# Patient Record
Sex: Female | Born: 1980 | Race: White | Hispanic: No | Smoking: Never smoker
Health system: Southern US, Community
[De-identification: ages and names within clinical notes are randomized; demographics above are authoritative.]

## PROBLEM LIST (undated history)

## (undated) DIAGNOSIS — G7100 Muscular dystrophy, unspecified: Secondary | ICD-10-CM

## (undated) DIAGNOSIS — E785 Hyperlipidemia, unspecified: Secondary | ICD-10-CM

## (undated) DIAGNOSIS — E079 Disorder of thyroid, unspecified: Secondary | ICD-10-CM

## (undated) HISTORY — PX: ABLATION: SHX5711

## (undated) HISTORY — PX: TUBAL LIGATION: SHX77

## (undated) HISTORY — PX: TONSILLECTOMY: SUR1361

## (undated) HISTORY — PX: FOOT SURGERY: SHX648

## (undated) HISTORY — PX: APPENDECTOMY: SHX54

---
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
## 2002-10-30 ENCOUNTER — Ambulatory Visit (HOSPITAL_COMMUNITY): Admission: RE | Admit: 2002-10-30 | Discharge: 2002-10-30 | Payer: Self-pay | Admitting: Obstetrics and Gynecology

## 2002-10-30 ENCOUNTER — Encounter: Payer: Self-pay | Admitting: Obstetrics and Gynecology

## 2003-02-24 ENCOUNTER — Inpatient Hospital Stay (HOSPITAL_COMMUNITY): Admission: RE | Admit: 2003-02-24 | Discharge: 2003-02-26 | Payer: Self-pay | Admitting: Obstetrics and Gynecology

## 2005-08-29 ENCOUNTER — Emergency Department (HOSPITAL_COMMUNITY): Admission: EM | Admit: 2005-08-29 | Discharge: 2005-08-29 | Payer: Self-pay | Admitting: Emergency Medicine

## 2007-07-23 ENCOUNTER — Emergency Department (HOSPITAL_COMMUNITY): Admission: EM | Admit: 2007-07-23 | Discharge: 2007-07-23 | Payer: Self-pay | Admitting: Emergency Medicine

## 2008-05-15 ENCOUNTER — Emergency Department (HOSPITAL_COMMUNITY): Admission: EM | Admit: 2008-05-15 | Discharge: 2008-05-15 | Payer: Self-pay | Admitting: Emergency Medicine

## 2008-09-04 ENCOUNTER — Encounter: Payer: Self-pay | Admitting: Orthopedic Surgery

## 2008-09-04 ENCOUNTER — Emergency Department (HOSPITAL_COMMUNITY): Admission: EM | Admit: 2008-09-04 | Discharge: 2008-09-04 | Payer: Self-pay | Admitting: Emergency Medicine

## 2008-09-09 ENCOUNTER — Ambulatory Visit: Payer: Self-pay | Admitting: Orthopedic Surgery

## 2008-09-09 DIAGNOSIS — IMO0002 Reserved for concepts with insufficient information to code with codable children: Secondary | ICD-10-CM

## 2008-09-25 ENCOUNTER — Ambulatory Visit: Payer: Self-pay | Admitting: Orthopedic Surgery

## 2008-10-08 ENCOUNTER — Ambulatory Visit: Payer: Self-pay | Admitting: Orthopedic Surgery

## 2008-10-08 ENCOUNTER — Ambulatory Visit (HOSPITAL_COMMUNITY): Admission: RE | Admit: 2008-10-08 | Discharge: 2008-10-08 | Payer: Self-pay | Admitting: Orthopedic Surgery

## 2008-12-29 ENCOUNTER — Emergency Department (HOSPITAL_COMMUNITY): Admission: EM | Admit: 2008-12-29 | Discharge: 2008-12-29 | Payer: Self-pay | Admitting: Emergency Medicine

## 2009-05-24 ENCOUNTER — Emergency Department (HOSPITAL_COMMUNITY): Admission: EM | Admit: 2009-05-24 | Discharge: 2009-05-24 | Payer: Self-pay | Admitting: Emergency Medicine

## 2009-06-17 ENCOUNTER — Emergency Department (HOSPITAL_COMMUNITY): Admission: EM | Admit: 2009-06-17 | Discharge: 2009-06-17 | Payer: Self-pay | Admitting: Emergency Medicine

## 2009-08-01 ENCOUNTER — Emergency Department (HOSPITAL_COMMUNITY): Admission: EM | Admit: 2009-08-01 | Discharge: 2009-08-01 | Payer: Self-pay | Admitting: Emergency Medicine

## 2009-10-23 ENCOUNTER — Emergency Department (HOSPITAL_COMMUNITY): Admission: EM | Admit: 2009-10-23 | Discharge: 2009-10-23 | Payer: Self-pay | Admitting: Emergency Medicine

## 2009-10-23 ENCOUNTER — Ambulatory Visit (HOSPITAL_COMMUNITY): Admission: RE | Admit: 2009-10-23 | Discharge: 2009-10-23 | Payer: Self-pay | Admitting: Family Medicine

## 2010-01-09 ENCOUNTER — Emergency Department (HOSPITAL_COMMUNITY): Admission: EM | Admit: 2010-01-09 | Discharge: 2010-01-09 | Payer: Self-pay | Admitting: Emergency Medicine

## 2010-03-11 ENCOUNTER — Emergency Department (HOSPITAL_COMMUNITY): Admission: EM | Admit: 2010-03-11 | Discharge: 2010-03-11 | Payer: Self-pay | Admitting: Emergency Medicine

## 2010-05-25 ENCOUNTER — Emergency Department (HOSPITAL_COMMUNITY): Admission: EM | Admit: 2010-05-25 | Discharge: 2010-05-25 | Payer: Self-pay | Admitting: Emergency Medicine

## 2010-05-31 ENCOUNTER — Other Ambulatory Visit: Admission: RE | Admit: 2010-05-31 | Discharge: 2010-05-31 | Payer: Self-pay | Admitting: Obstetrics and Gynecology

## 2010-12-06 ENCOUNTER — Other Ambulatory Visit (HOSPITAL_COMMUNITY): Payer: Medicaid Other

## 2010-12-08 ENCOUNTER — Encounter (HOSPITAL_COMMUNITY)
Admission: RE | Admit: 2010-12-08 | Discharge: 2010-12-08 | Disposition: A | Discharge status: Home / Self Care | Payer: Medicaid Other | Source: Ambulatory Visit | Attending: Obstetrics and Gynecology | Admitting: Obstetrics and Gynecology

## 2010-12-08 DIAGNOSIS — Z01812 Encounter for preprocedural laboratory examination: Secondary | ICD-10-CM | POA: Insufficient documentation

## 2010-12-08 LAB — CBC
HCT: 32.6 % — ABNORMAL LOW (ref 36.0–46.0)
MCH: 26.2 pg (ref 26.0–34.0)
MCHC: 32.5 g/dL (ref 30.0–36.0)
MCV: 80.5 fL (ref 78.0–100.0)
RDW: 15.2 % (ref 11.5–15.5)

## 2010-12-11 NOTE — H&P (Signed)
  [name], [name]             ACCOUNT NO.:  [id number]  MEDICAL RECORD NO.:  [id number]           PATIENT TYPE:  O  LOCATION:  SDC                           F[facility]ILITY:  [facility]  PHYSICIAN:  Tilda Burrow, M.D. DATE OF BIRTH:  1980/11/16  DATE OF ADMISSION:  12/08/2010 DATE OF DISCHARGE:  12/08/2010                             HISTORY & PHYSICAL   This is for procedure on December 13, 2010.  ADMISSION DIAGNOSES: 1. Pregnancy at 39 weeks' gestation. 2. Prior cesarean section x2. 3. Not for trial of labor. 4. Elective permanent sterilization.  HISTORY OF PRESENT ILLNESS:  This 30 year old female gravida 3, para 2-0- 0-2, prior C-section x2, both documented low transverse incisions, is scheduled for repeat cesarean section at [facility] at 4 p.m. on December 13, 2010.  Prenatal course has been followed through [facility] through 16 prenatal visits with appropriate weight gain, fundal height growth.  The patient desires to proceed with repeat cesarean section and desires permanent sterilization at the same time.  She signed appropriate consents in the office.  PAST MEDICAL HISTORY:  Late pregnancy course has been notable for complaints of chronic discomfort allegedly related to a back discomfort. More recently, she has been on some Tylox.  This will be tapered immediately upon completion of the cesarean section and appropriate recovery.  She has been treated with muscle relaxers, those not helping her.  She has been offered referral to chiropractic and she has found her best relief when using opiates.  This back pain began in January 2012 allegedly after a sudden sharp pain that began after sneezing.  Prior to that, there was no pain management requirement.  PAST MEDICAL HISTORY:  Benign.  SURGICAL HISTORY:  Cesarean section x2, tonsillectomy, toe surgery.  ALLERGIES:  To PENICILLIN causing unknown reaction.  SOCIAL HISTORY:  Lives with baby's  father.  She is employed.  She denies cigarettes, alcohol, or recreational drugs.  MEDICATIONS:  Prenatal vitamins and Tylox.  FAMILY HISTORY:  Positive for hypertension and diabetes.  PHYSICAL EXAMINATION:  GENERAL:  Shows a healthy-appearing African American female, alert and oriented x3. HEENT:  Pupils equal, round, and reactive.  Extraocular movements intact. NECK:  Supple. CHEST:  Clear to auscultation. ABDOMEN:  Gravid uterus, 38 cm, fundal height 140, vertex presentation, well-healed lower abdominal scar. EXTREMITIES:  Without cyanosis, clubbing, or edema.  The discomfort in the back has been paraspinous muscles.  PLAN:  Repeat cesarean section and bilateral tubal ligation at 4 p.m. in [facility] on December 13, 2010.     Tilda Burrow, M.D.     JVF/MEDQ  D:  12/09/2010  T:  12/09/2010  Job:  [id number]  cc:   [facility]  [facility]  Electronically Signed by Christin Bach M.D. on 12/11/2010 06:42:46 PM

## 2010-12-13 ENCOUNTER — Inpatient Hospital Stay (HOSPITAL_COMMUNITY)
Admission: RE | Admit: 2010-12-13 | Discharge: 2010-12-15 | DRG: 766 | Disposition: A | Discharge status: Home / Self Care | Payer: Medicaid Other | Source: Ambulatory Visit | Attending: Obstetrics and Gynecology | Admitting: Obstetrics and Gynecology

## 2010-12-13 DIAGNOSIS — O34219 Maternal care for unspecified type scar from previous cesarean delivery: Principal | ICD-10-CM | POA: Diagnosis present

## 2010-12-13 DIAGNOSIS — Z302 Encounter for sterilization: Secondary | ICD-10-CM

## 2010-12-13 LAB — TYPE AND SCREEN: Antibody Screen: NEGATIVE

## 2010-12-13 LAB — ABO/RH: ABO/RH(D): A POS

## 2010-12-15 LAB — CBC
Hemoglobin: 8.8 g/dL — ABNORMAL LOW (ref 12.0–15.0)
Platelets: 319 10*3/uL (ref 150–400)
RBC: 3.34 MIL/uL — ABNORMAL LOW (ref 3.87–5.11)

## 2010-12-16 NOTE — Op Note (Signed)
  NAMERAYSHAWN, [name]             ACCOUNT NO.:  [id number]  MEDICAL RECORD NO.:  [id number]           PATIENT TYPE:  I  LOCATION:  9115                          [facility]ILITY:  [facility]  PHYSICIAN:  Tilda Burrow, M.D. DATE OF BIRTH:  12-Feb-1981  DATE OF PROCEDURE:  12/13/2010 DATE OF DISCHARGE:  12/08/2010                              OPERATIVE REPORT   PREOPERATIVE DIAGNOSES: 1. Pregnancy 39 weeks and 2 days. 2. Repeat cesarean section, not for trial of labor. 3. Elective permanent sterilization.  POSTOPERATIVE DIAGNOSES: 1. Pregnancy 39 weeks and 2 days. 2. Repeat cesarean section, not for trial of labor. 3. Elective permanent sterilization.  PROCEDURE:  Repeat low transverse cervical cesarean section.  SURGEONS: 1. Tilda Burrow, MD 2. Lucina Mellow, DO  ANESTHESIA:  Spinal. COMPLICATIONS:  None.  FINDINGS: Healthy female infant. SPECIMENS:  Placenta to Labor and Delivery.  ESTIMATED BLOOD LOSS:  600 mL. COMPLICATIONS:  None.  INDICATIONS:  A 30 year old multipara with 2 prior C-sections, desire repeat C-section and tubal ligation.  DETAILS OF PROCEDURE:  The patient was taken operating room and prepped and draped in usual fashion for lower abdominal surgery time after spinal anesthesia introduced and analgesic effect confirmed.  There was a hotspot distal left of the umbilicus which required a few extra minutes until analgesia was achieved.  Time-out was conducted. Procedure confirmed by all involved parties.  The patient received gentamicin and Cleocin preoperative antibiotics due to penicillin allergy.  Transverse lower abdominal incision was made along the line of the prior Pfannenstiel incision and sharply dissected down to the fascia which was opened transversely and then dissected off the underlying rectus muscles.  Peritoneal cavity was entered in the midline and then the incision into the abdominal cavity completed.  Bladder flap was developed and  transverse uterine incision made with knife and then extended laterally using index finger traction.  Generous amniotic fluid was encountered and it was clear without malodor.  Fetal vertex was guided through the incision.  The infant delivered without further difficulty and passed the pediatrician.  See their notes for details. Placenta was delivered by Crede uterine massage and passed off intact. Three-vessel cord was confirmed plus specimens were obtained.  Uterus was firm.  There was no abnormal bleeding and closure of the uterine incision was a single layer running locking incision.  Bladder flap did not require reapproximation.  Abdomen was cleared of any additional fluid.  A Filshie clip application on each tube at the midportion was performed with each tube visualize to the fimbriated end.  Anterior peritoneum was closed with 2-0 Vicryl.  Fascia closed with running 0 Vicryl.  Subcu tissues approximated with interrupted 2-0 Vicryl x3 sites, and then subcuticular 4-0 Vicryl closure of skin completed the procedure with 500 mL EBL and correct sponge and needle counts.     Tilda Burrow, M.D.     JVF/MEDQ  D:  12/13/2010  T:  12/14/2010  Job:  [id number]  cc:   [facility]  Electronically Signed by Christin Bach M.D. on 12/16/2010 08:46:18 PM

## 2010-12-20 NOTE — Discharge Summary (Addendum)
  [name], [name]             ACCOUNT NO.:  [id number]  MEDICAL RECORD NO.:  [id number]           PATIENT TYPE:  I  LOCATION:  9115                          [facility]  [facility]  PHYSICIAN:  Scheryl Darter, MD       DATE OF BIRTH:  1981/02/25  DATE OF ADMISSION:  12/13/2010 DATE OF DISCHARGE:                              DISCHARGE SUMMARY   ADMISSION DIAGNOSES: 1. Intrauterine pregnancy at 39 weeks and 0 days. 2. Previous cesarean section x2. 3. Desired elective permanent sterilization.  DISCHARGE DIAGNOSES:  Status post repeat low transverse cesarean section with bilateral tubal ligation on December 13, 2010.  ATTENDING DOCTOR:  Horton Chin, MD  FELLOW:  Maryelizabeth Kaufmann, MD  DISCHARGE MEDICATIONS: 1. Percocet 5/325 one tab p.o. q.4 h. as needed. 2. Ibuprofen 600 mg 1 tab p.o. q.6 h. 3. Ferrous sulphate 325 one tab p.o. twice daily. 4. Colace 100 mg 1 tab p.o. twice daily. 5. Multivitamin 1 tab p.o. daily.  OPERATIONS:  She did have a repeat low transverse cesarean section with bilateral tubal ligation on December 13, 2010, by Dr. Emelda Fear and Dr. Natale Milch.  The delivery of a viable female infant, Apgars were 9 and 9, weight was 7 pounds and 2 ounces.  There were no complications immediately.  PERTINENT LABORATORY DATA:  Postoperative hemoglobin was pending at the time of this dictation, but was bothered prior to discharge.  HOSPITAL COURSE:  This is a 30 year old, gravida 3, para 2-0-0-2 with history of 2 previous cesarean section, who presented on the day of admission with intrauterine pregnancy at 39 weeks and 0 days for an elective repeat C-section and elective permanent sterilization.  The patient underwent surgery for that aforementioned reasons with a delivery of a viable female infant.  Her postoperative course was benign.  She was otherwise hemodynamically stable and afebrile at the time of this dictation.  She did have a postoperative hemoglobin  that was pending.  If her hemoglobin is stable, then she will be discharged home in stable condition on postoperative day 2.  DISPOSITION:  Discharged home.  DISCHARGE CONDITION:  Stable.  FOLLOWUP:  The patient to followup in [facility] in 2 weeks for postoperative wound check.  EMERGENCY ROOM WARNINGS:  The patient is to return to the emergency department with any fevers, chills, nausea, vomiting, any problems with her incision such as redness, swelling, discharge, opening or any other concerning symptoms.    ______________________________ Maryelizabeth Kaufmann, MD   ______________________________ Scheryl Darter, MD    LC/MEDQ  D:  12/15/2010  T:  12/15/2010  Job:  [id number]  Electronically Signed by Scheryl Darter MD on 01/17/2011 11:32:52 AM Electronically Signed by Maryelizabeth Kaufmann MD on 01/24/2011 12:25:28 PM

## 2011-01-03 LAB — CBC
Hemoglobin: 11.5 g/dL — ABNORMAL LOW (ref 12.0–15.0)
MCHC: 33.8 g/dL (ref 30.0–36.0)
MCV: 82.8 fL (ref 78.0–100.0)
RBC: 4.13 MIL/uL (ref 3.87–5.11)
WBC: 14.1 10*3/uL — ABNORMAL HIGH (ref 4.0–10.5)

## 2011-01-03 LAB — BASIC METABOLIC PANEL
CO2: 25 mEq/L (ref 19–32)
Calcium: 9.3 mg/dL (ref 8.4–10.5)
Chloride: 104 mEq/L (ref 96–112)
GFR calc Af Amer: >60 mL/min (ref >60)
Sodium: 136 mEq/L (ref 135–145)

## 2011-01-03 LAB — URINALYSIS, ROUTINE W REFLEX MICROSCOPIC
Bilirubin Urine: NEGATIVE
Glucose, UA: NEGATIVE mg/dL
Hgb urine dipstick: NEGATIVE
Specific Gravity, Urine: 1.01 (ref 1.005–1.030)
Urobilinogen, UA: 0.2 mg/dL (ref 0.0–1.0)

## 2011-01-03 LAB — DIFFERENTIAL
Basophils Relative: 1 % (ref 0–1)
Lymphs Abs: 4.2 10*3/uL — ABNORMAL HIGH (ref 0.7–4.0)
Monocytes Absolute: 0.6 10*3/uL (ref 0.1–1.0)
Monocytes Relative: 4 % (ref 3–12)
Neutro Abs: 8.8 10*3/uL — ABNORMAL HIGH (ref 1.7–7.7)

## 2011-01-03 LAB — POCT PREGNANCY, URINE: Preg Test, Ur: NEGATIVE

## 2011-01-09 LAB — COMPREHENSIVE METABOLIC PANEL
Albumin: 3.7 g/dL (ref 3.5–5.2)
Alkaline Phosphatase: 83 U/L (ref 39–117)
BUN: 9 mg/dL (ref 6–23)
CO2: 29 mEq/L (ref 19–32)
Chloride: 103 mEq/L (ref 96–112)
Creatinine, Ser: 0.85 mg/dL (ref 0.4–1.2)
GFR calc non Af Amer: >60 mL/min (ref >60)
Glucose, Bld: 99 mg/dL (ref 70–99)
Potassium: 4.1 mEq/L (ref 3.5–5.1)
Total Bilirubin: 0.3 mg/dL (ref 0.3–1.2)

## 2011-01-09 LAB — CBC
HCT: 35.1 % — ABNORMAL LOW (ref 36.0–46.0)
Hemoglobin: 12.1 g/dL (ref 12.0–15.0)
MCV: 82.5 fL (ref 78.0–100.0)
Platelets: 482 10*3/uL — ABNORMAL HIGH (ref 150–400)
RBC: 4.26 MIL/uL (ref 3.87–5.11)
WBC: 9.5 10*3/uL (ref 4.0–10.5)

## 2011-01-09 LAB — DIFFERENTIAL
Basophils Absolute: 0.1 10*3/uL (ref 0.0–0.1)
Basophils Relative: 1 % (ref 0–1)
Lymphocytes Relative: 26 % (ref 12–46)
Monocytes Absolute: 0.5 10*3/uL (ref 0.1–1.0)
Neutro Abs: 6.2 10*3/uL (ref 1.7–7.7)
Neutrophils Relative %: 65 % (ref 43–77)

## 2011-01-09 LAB — LIPASE, BLOOD: Lipase: 40 U/L (ref 11–59)

## 2011-01-20 LAB — BASIC METABOLIC PANEL
BUN: 11 mg/dL (ref 6–23)
Calcium: 9.5 mg/dL (ref 8.4–10.5)
GFR calc non Af Amer: >60 mL/min (ref >60)
Glucose, Bld: 131 mg/dL — ABNORMAL HIGH (ref 70–99)
Potassium: 3.4 mEq/L — ABNORMAL LOW (ref 3.5–5.1)
Sodium: 140 mEq/L (ref 135–145)

## 2011-01-20 LAB — DIFFERENTIAL
Basophils Absolute: 0 10*3/uL (ref 0.0–0.1)
Lymphocytes Relative: 26 % (ref 12–46)
Lymphs Abs: 3.2 10*3/uL (ref 0.7–4.0)
Neutro Abs: 7.9 10*3/uL — ABNORMAL HIGH (ref 1.7–7.7)

## 2011-01-20 LAB — CBC
Hemoglobin: 12.1 g/dL (ref 12.0–15.0)
Platelets: 414 10*3/uL — ABNORMAL HIGH (ref 150–400)
RDW: 13.9 % (ref 11.5–15.5)
WBC: 12 10*3/uL — ABNORMAL HIGH (ref 4.0–10.5)

## 2011-01-21 LAB — URINALYSIS, ROUTINE W REFLEX MICROSCOPIC
Nitrite: NEGATIVE
Specific Gravity, Urine: >1.03 — ABNORMAL HIGH (ref 1.005–1.030)
Urobilinogen, UA: 0.2 mg/dL (ref 0.0–1.0)
pH: 6 (ref 5.0–8.0)

## 2011-01-21 LAB — PREGNANCY, URINE: Preg Test, Ur: NEGATIVE

## 2011-01-21 LAB — WET PREP, GENITAL
Clue Cells Wet Prep HPF POC: NONE SEEN
Trich, Wet Prep: NONE SEEN
WBC, Wet Prep HPF POC: NONE SEEN

## 2011-01-27 LAB — URINE MICROSCOPIC-ADD ON

## 2011-01-27 LAB — URINALYSIS, ROUTINE W REFLEX MICROSCOPIC
Glucose, UA: NEGATIVE mg/dL
Ketones, ur: NEGATIVE mg/dL
Protein, ur: NEGATIVE mg/dL
Urobilinogen, UA: 0.2 mg/dL (ref 0.0–1.0)

## 2011-03-04 NOTE — Op Note (Signed)
NAME:  [name], [name]                       ACCOUNT NO.:  [id number]   MEDICAL RECORD NO.:  [id number]                   PATIENT TYPE:  AMB   LOCATION:  DAY                                  FACILITY:  [facility]   PHYSICIAN:  Tilda Burrow, M.D.              DATE OF BIRTH:  01-26-1981   DATE OF PROCEDURE:  DATE OF DISCHARGE:                                 OPERATIVE REPORT   PREOPERATIVE DIAGNOSES:  1. Pregnancy 39-1/[redacted] weeks gestation.  2. Prior cesarean section not for trial of labor.   POSTOPERATIVE DIAGNOSES:  1. Pregnancy 39-1/[redacted] weeks gestation.  2. Prior cesarean section not for trial of labor.   PROCEDURE:  Repeat low transverse cervical cesarean section.   SURGEON:  Tilda Burrow, M.D.   ASSISTANTMarlinda Mike, RN; Karna Dupes, [place]   ANESTHESIA:  Spinal--T. Ophelia Charter, CRNA   COMPLICATIONS:  None.   FINDINGS:  Healthy female infant, Apgars 9 and 9, weight _________.   INDICATIONS:  A 30 year old female not requesting trial of labor.   DETAILS OF PROCEDURE:  The patient was taken to the operating room and  spinal anesthesia introduced and Pfannenstiel-type incision repeated with  excision of the thin cicatrix.  The peritoneal cavity was entered without  difficulty and bladder flap developed over the lower uterine segment which  was quite thin.  A transverse uterine incision was made, extended laterally  using index finger traction and forewaters confirmed as clear.   Fundal pressure was used to deliver the infant with successful expulsion of  a healthy female infant with Apgars of 9 and 9 without difficulty.  Nuchal  cord x1 was encountered and released.  The cord was milked towards the  infant and then clamped cut and passed to Harley Hallmark, RN for neonatal  support.   Cord blood samples were obtained and then placenta delivered [facility]  presentation.  Membranes were intact.  Uterine irrigation with antibiotic  solution was performed.  The transverse incision was only  approximately 1-  1.5 cm above the internal os.  A single layer of running locking closure of  the uterus incision was performed with a small hematoma at the right corner  requiring a single additional stitch on the inferior aspects of the uterine  incision.  The bladder flap was reapproximated with running 2-0 chromic,  then the abdomen irrigated, laparotomy equipment and tapes removed, and  anterior peritoneum closed with 2-0 chromic.  The fascia was reapproximated  with continuous  running #0 Vicryl.  The subcutaneous tissues contoured slightly to allow for  good closure by release of some fibrosis on the inferior aspects of the  incision and then reapproximated with 3 interrupted of 2-0 plain.  Staple  closure of skin completed the procedure.  Estimated blood loss 400 cc.  Tilda Burrow, M.D.    JVF/MEDQ  D:  02/24/2003  T:  02/24/2003  Job:  [id number]   cc:   ATTENTION: [facility]. [facility]

## 2011-03-04 NOTE — Discharge Summary (Signed)
   [name], [name]                       ACCOUNT NO.:  [id number]   MEDICAL RECORD NO.:  [id number]                   PATIENT TYPE:  INP   LOCATION:  A418                                 FACILITY:  [facility]   PHYSICIAN:  Tilda Burrow, M.D.              DATE OF BIRTH:  04/28/1981   DATE OF ADMISSION:  02/24/2003  DATE OF DISCHARGE:  02/26/2003                                 DISCHARGE SUMMARY   ADMITTING DIAGNOSIS:  Pregnancy at 40+ weeks gestation.  Repeat cesarean  section.   DISCHARGE DIAGNOSIS:  Pregnancy at 40+ weeks gestation.  Repeat cesarean  section.   PROCEDURES:  1. 02/24/03, repeat low transverse cervical cesarean section, delivering a     healthy female infant, Apgars 9, 9.  2. Circumcision of female infant on 02/25/03.   DISCHARGE MEDICATIONS:  Darvocet-N 100, 30 tablets, 1-2 Q4H p.r.n. pain.   DISCHARGE INSTRUCTIONS:  Staples removed, postop day 2.  Follow-up  contraception planned.  IUD placement.  Postpartum visit four weeks.   HOSPITAL SUMMARY:  This 30 year old female, gravida 2, para 1, ab 0, now  gravida 2, para 2, was admitted for repeat low transverse cervical cesarean  section without plans for sterilization.   HOSPITAL COURSE:  The patient was admitted as described in the admitting  history after an uncomplicated prenatal course.  She underwent a  straightforward cesarean section with 400 mL blood loss through a transverse  lower abdominal incision.  She had a very easily tolerated postoperative  course and was ambulatory with Foley out in six hours postop.  Hemoglobin  was 10.  Hematocrit was 29.5 on postoperative day #1.  The baby was  circumcised.  On postop day 2, she was discharged.  Staples were removed.  Followup was arranged for four weeks, at which time IUD will be inserted or  scheduled that should coincide with her first period.  The patient is bottle  feeding.                                               Tilda Burrow, M.D.    JVF/MEDQ  D:  02/26/2003  T:  02/27/2003  Job:  [id number]

## 2011-03-04 NOTE — H&P (Signed)
[name], [name]                       ACCOUNT NO.:  [id number]   MEDICAL RECORD NO.:  [id number]                   PATIENT TYPE:   LOCATION:                                       FACILITY:   PHYSICIAN:  Tilda Burrow, M.D.              DATE OF BIRTH:  09/02/1981   DATE OF ADMISSION:  02/24/2003  DATE OF DISCHARGE:                                HISTORY & PHYSICAL   ADMISSION DIAGNOSES:  1. Pregnancy at 40 weeks' gestation.  2. Prior cesarean section, for repeat cesarean section.   HISTORY OF PRESENT ILLNESS:  This is a 30 year old female, gravida 2, para  1, AB 0, LMP ? 04/30/02, due to Depo-Provera, placing menstrual EDC of  02/05/03.  Her ultrasound-assigned EDC, considered more correct, is 03/01/03  based on 20-week ultrasound.  The patient has been followed through our  office since December for routine prenatal care with an uncomplicated  course.  She is now 39+ weeks by that ultrasound criteria and is admitted  for repeat cesarean section.  She has a history of a C-section of a 7 pound  6 ounce infant for failure to progress in March 2002.  This baby is  considered larger.  The patient does not consider herself a candidate for  sterilization and plans only repeat C-section.   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   PAST SURGICAL HISTORY:  Laparoscopic appendectomy in 2000.  Right toe  surgery.  Tonsillectomy as a child.  Cesarean section 3/02.   ALLERGIES:  PENICILLIN.  The patient specifically states she can take Keflex  and cephalosporins.   MEDICATIONS:  None except prenatal vitamins.   PHYSICAL EXAMINATION:  VITAL SIGNS:  Height 5 feet 4 inches, weight 200.  GENERAL:  A cheerful, pleasant, healthy-appearing Caucasian female, alert  and oriented x3.  HEENT:  Pupils equal, round, and reactive.  Extraocular movements intact.  NECK:  Supple.   Trachea midline.  CHEST:  Clear to auscultation.  ABDOMEN:  Fundal height 39 cm.  Estimated fetal weight 8 pounds.  PELVIC:  Cervix deferred at this time but firm, closed, posterior, and long  one week ago.   PRENATAL LABORATORY DATA:  Blood type A positive.  Urine drug screen  negative.  RPR nonreactive.  Hepatitis, HIV, GC, and Chlamydia also  negative.  Rubella immunity present.  MSAFP normal with a one in 800 risk of  Down syndrome.  Prior group B strep was positive.  Hemoglobin 10, hematocrit  33 at 28 weeks.    PLAN:  The patient's husband, Molly Maduro, plans to attend the delivery with her,  and she plans IUD for contraception in the future.  Circumcision is planned  if it is a female infant.   Plan repeat cesarean section 02/24/03, 7:30 a.m.  Tilda Burrow, M.D.    JVF/MEDQ  D:  02/19/2003  T:  02/19/2003  Job:  [id number]

## 2011-04-03 ENCOUNTER — Emergency Department (HOSPITAL_COMMUNITY)
Admission: EM | Admit: 2011-04-03 | Discharge: 2011-04-03 | Disposition: A | Discharge status: Home / Self Care | Payer: Self-pay | Attending: Emergency Medicine | Admitting: Emergency Medicine

## 2011-04-03 ENCOUNTER — Emergency Department (HOSPITAL_COMMUNITY): Payer: Self-pay

## 2011-04-03 DIAGNOSIS — N390 Urinary tract infection, site not specified: Secondary | ICD-10-CM | POA: Insufficient documentation

## 2011-04-03 DIAGNOSIS — R112 Nausea with vomiting, unspecified: Secondary | ICD-10-CM | POA: Insufficient documentation

## 2011-04-03 DIAGNOSIS — G8929 Other chronic pain: Secondary | ICD-10-CM | POA: Insufficient documentation

## 2011-04-03 DIAGNOSIS — R109 Unspecified abdominal pain: Secondary | ICD-10-CM | POA: Insufficient documentation

## 2011-04-03 DIAGNOSIS — G7109 Other specified muscular dystrophies: Secondary | ICD-10-CM | POA: Insufficient documentation

## 2011-04-03 DIAGNOSIS — E7800 Pure hypercholesterolemia, unspecified: Secondary | ICD-10-CM | POA: Insufficient documentation

## 2011-04-03 LAB — HEPATIC FUNCTION PANEL
Alkaline Phosphatase: 112 U/L (ref 39–117)
Indirect Bilirubin: 0.3 mg/dL (ref 0.3–0.9)
Total Bilirubin: 0.4 mg/dL (ref 0.3–1.2)
Total Protein: 8.1 g/dL (ref 6.0–8.3)

## 2011-04-03 LAB — URINE MICROSCOPIC-ADD ON

## 2011-04-03 LAB — URINALYSIS, ROUTINE W REFLEX MICROSCOPIC
Specific Gravity, Urine: >1.03 — ABNORMAL HIGH (ref 1.005–1.030)
Urobilinogen, UA: 0.2 mg/dL (ref 0.0–1.0)
pH: 6 (ref 5.0–8.0)

## 2011-04-03 LAB — DIFFERENTIAL
Basophils Absolute: 0 10*3/uL (ref 0.0–0.1)
Eosinophils Relative: 4 % (ref 0–5)
Lymphocytes Relative: 18 % (ref 12–46)
Lymphs Abs: 2.8 10*3/uL (ref 0.7–4.0)
Neutro Abs: 11.8 10*3/uL — ABNORMAL HIGH (ref 1.7–7.7)
Neutrophils Relative %: 74 % (ref 43–77)

## 2011-04-03 LAB — CBC
HCT: 37 % (ref 36.0–46.0)
Hemoglobin: 12 g/dL (ref 12.0–15.0)
RBC: 4.76 MIL/uL (ref 3.87–5.11)
RDW: 14.4 % (ref 11.5–15.5)
WBC: 16 10*3/uL — ABNORMAL HIGH (ref 4.0–10.5)

## 2011-04-03 LAB — LIPASE, BLOOD: Lipase: 53 U/L (ref 11–59)

## 2011-04-03 LAB — BASIC METABOLIC PANEL
CO2: 25 mEq/L (ref 19–32)
Calcium: 9.9 mg/dL (ref 8.4–10.5)
GFR calc non Af Amer: >60 mL/min (ref >60)
Sodium: 139 mEq/L (ref 135–145)

## 2011-04-03 LAB — POCT PREGNANCY, URINE: Preg Test, Ur: NEGATIVE

## 2011-04-03 MED ORDER — IOHEXOL 300 MG/ML  SOLN
100.0000 mL | Freq: Once | INTRAMUSCULAR | Status: AC | PRN
  Administered 2011-04-03: 100 mL via INTRAVENOUS

## 2011-12-25 ENCOUNTER — Encounter (HOSPITAL_COMMUNITY): Payer: Self-pay | Admitting: Emergency Medicine

## 2011-12-25 ENCOUNTER — Emergency Department (HOSPITAL_COMMUNITY)
Admission: EM | Admit: 2011-12-25 | Discharge: 2011-12-25 | Disposition: A | Discharge status: Home Health | Payer: Medicaid Other | Attending: Emergency Medicine | Admitting: Emergency Medicine

## 2011-12-25 DIAGNOSIS — S20229A Contusion of unspecified back wall of thorax, initial encounter: Secondary | ICD-10-CM

## 2011-12-25 DIAGNOSIS — S39012A Strain of muscle, fascia and tendon of lower back, initial encounter: Secondary | ICD-10-CM

## 2011-12-25 DIAGNOSIS — E079 Disorder of thyroid, unspecified: Secondary | ICD-10-CM | POA: Insufficient documentation

## 2011-12-25 DIAGNOSIS — E785 Hyperlipidemia, unspecified: Secondary | ICD-10-CM | POA: Insufficient documentation

## 2011-12-25 DIAGNOSIS — Y990 Civilian activity done for income or pay: Secondary | ICD-10-CM | POA: Insufficient documentation

## 2011-12-25 DIAGNOSIS — R296 Repeated falls: Secondary | ICD-10-CM | POA: Insufficient documentation

## 2011-12-25 DIAGNOSIS — S335XXA Sprain of ligaments of lumbar spine, initial encounter: Secondary | ICD-10-CM | POA: Insufficient documentation

## 2011-12-25 DIAGNOSIS — G7109 Other specified muscular dystrophies: Secondary | ICD-10-CM | POA: Insufficient documentation

## 2011-12-25 DIAGNOSIS — Y9289 Other specified places as the place of occurrence of the external cause: Secondary | ICD-10-CM | POA: Insufficient documentation

## 2011-12-25 HISTORY — DX: Hyperlipidemia, unspecified: E78.5

## 2011-12-25 HISTORY — DX: Disorder of thyroid, unspecified: E07.9

## 2011-12-25 HISTORY — DX: Muscular dystrophy, unspecified: G71.00

## 2011-12-25 MED ORDER — IBUPROFEN 800 MG PO TABS: 800.0000 mg | ORAL_TABLET | Freq: Three times a day (TID) | ORAL | Status: AC

## 2011-12-25 MED ORDER — BACLOFEN 10 MG PO TABS: 10.0000 mg | ORAL_TABLET | Freq: Three times a day (TID) | ORAL | Status: AC

## 2011-12-25 MED ORDER — DIAZEPAM 5 MG PO TABS
5.0000 mg | ORAL_TABLET | Freq: Once | ORAL | Status: AC
  Administered 2011-12-25: 5 mg via ORAL
  Filled 2011-12-25: qty 1

## 2011-12-25 MED ORDER — HYDROCODONE-ACETAMINOPHEN 7.5-325 MG PO TABS: 1.0000 | ORAL_TABLET | ORAL | Status: AC | PRN

## 2011-12-25 MED ORDER — ONDANSETRON HCL 4 MG PO TABS
4.0000 mg | ORAL_TABLET | Freq: Once | ORAL | Status: AC
  Administered 2011-12-25: 4 mg via ORAL
  Filled 2011-12-25: qty 1

## 2011-12-25 MED ORDER — MORPHINE SULFATE 4 MG/ML IJ SOLN
8.0000 mg | Freq: Once | INTRAMUSCULAR | Status: AC
  Administered 2011-12-25: 8 mg via INTRAMUSCULAR
  Filled 2011-12-25: qty 2

## 2011-12-25 NOTE — ED Notes (Signed)
Pt tripped at work and fell over box and landed on back.  CC of mid to lower back pain

## 2011-12-25 NOTE — Discharge Instructions (Signed)
Please apply ice pack to your lower back today and tonight. Starting March 11 please alternate heat and ice to your lower back. Please rest her back as much as possible. Ibuprofen 800 mg 3 times daily with food, baclofen 3 times daily for spasm. Use Norco for pain if needed. Norco and baclofen may cause drowsiness, please use with caution.Contusion A contusion is a deep bruise. Contusions are the result of an injury that caused bleeding under the skin. The contusion may turn blue, purple, or yellow. Minor injuries will give you a painless contusion, but more severe contusions may stay painful and swollen for a few weeks.  CAUSES  A contusion is usually caused by a blow, trauma, or direct force to an area of the body. SYMPTOMS   Swelling and redness of the injured area.   Bruising of the injured area.   Tenderness and soreness of the injured area.   Pain.  DIAGNOSIS  The diagnosis can be made by taking a history and physical exam. An X-ray, CT scan, or MRI may be needed to determine if there were any associated injuries, such as fractures. TREATMENT  Specific treatment will depend on what area of the body was injured. In general, the best treatment for a contusion is resting, icing, elevating, and applying cold compresses to the injured area. Over-the-counter medicines may also be recommended for pain control. Ask your caregiver what the best treatment is for your contusion. HOME CARE INSTRUCTIONS   Put ice on the injured area.   Put ice in a plastic bag.   Place a towel between your skin and the bag.   Leave the ice on for 15 to 20 minutes, 3 to 4 times a day.   Only take over-the-counter or prescription medicines for pain, discomfort, or fever as directed by your caregiver. Your caregiver may recommend avoiding anti-inflammatory medicines (aspirin, ibuprofen, and naproxen) for 48 hours because these medicines may increase bruising.   Rest the injured area.   If possible, elevate the  injured area to reduce swelling.  SEEK IMMEDIATE MEDICAL CARE IF:   You have increased bruising or swelling.   You have pain that is getting worse.   Your swelling or pain is not relieved with medicines.  MAKE SURE YOU:   Understand these instructions.   Will watch your condition.   Will get help right away if you are not doing well or get worse.  Document Released: 07/13/2005 Document Revised: 09/22/2011 Document Reviewed: 08/08/2011 [facility] Patient Information 2012 [place], [place].Muscle Strain A muscle strain, or pulled muscle, occurs when a muscle is over-stretched. A small number of muscle fibers may also be torn. This is especially common in athletes. This happens when a sudden violent force placed on a muscle pushes it past its capacity. Usually, recovery from a pulled muscle takes 1 to 2 weeks. But complete healing will take 5 to 6 weeks. There are millions of muscle fibers. Following injury, your body will usually return to normal quickly. HOME CARE INSTRUCTIONS   While awake, apply ice to the sore muscle for 15 to 20 minutes each hour for the first 2 days. Put ice in a plastic bag and place a towel between the bag of ice and your skin.   Do not use the pulled muscle for several days. Do not use the muscle if you have pain.   You may wrap the injured area with an elastic bandage for comfort. Be careful not to bind it too tightly. This may  interfere with blood circulation.   Only take over-the-counter or prescription medicines for pain, discomfort, or fever as directed by your caregiver. Do not use aspirin as this will increase bleeding (bruising) at injury site.   Warming up before exercise helps prevent muscle strains.  SEEK MEDICAL CARE IF:  There is increased pain or swelling in the affected area. MAKE SURE YOU:   Understand these instructions.   Will watch your condition.   Will get help right away if you are not doing well or get worse.  Document Released:  10/03/2005 Document Revised: 09/22/2011 Document Reviewed: 05/02/2007 [facility] Patient Information 2012 [place], [place].

## 2011-12-25 NOTE — ED Provider Notes (Signed)
History     CSN: [id number]  Arrival date & time 12/25/11  1244   First MD Initiated Contact with Patient 12/25/11 1254      Chief Complaint  Patient presents with  . Back Pain    (Consider location/radiation/quality/duration/timing/severity/associated sxs/prior treatment) Patient is a 31 y.o. female presenting with back pain. The history is provided by the patient.  Back Pain  This is a new problem. The current episode started yesterday. The problem occurs constantly. The problem has been gradually worsening. The pain is associated with falling (Patient fell over boxes at her job.). The pain is present in the lumbar spine. The quality of the pain is described as aching (spasm). The pain does not radiate. The pain is at a severity of 10/10. The pain is severe. The symptoms are aggravated by certain positions. The pain is the same all the time. Pertinent negatives include no chest pain, no numbness, no abdominal pain, no bowel incontinence, no perianal numbness, no bladder incontinence, no dysuria, no paresthesias and no paresis.    Past Medical History  Diagnosis Date  . Muscular dystrophy   . Thyroid disease   . Hyperlipemia     History reviewed. No pertinent past surgical history.  No family history on file.  History  Substance Use Topics  . Smoking status: Never Smoker   . Smokeless tobacco: Not on file  . Alcohol Use: No    OB History    Grav Para Term Preterm Abortions TAB SAB Ect Mult Living                  Review of Systems  Constitutional: Negative for activity change.       All ROS Neg except as noted in HPI  HENT: Negative for nosebleeds and neck pain.   Eyes: Negative for photophobia and discharge.  Respiratory: Negative for cough, shortness of breath and wheezing.   Cardiovascular: Negative for chest pain and palpitations.  Gastrointestinal: Negative for abdominal pain, blood in stool and bowel incontinence.  Genitourinary: Negative for bladder  incontinence, dysuria, frequency and hematuria.  Musculoskeletal: Positive for back pain. Negative for arthralgias.  Skin: Negative.   Neurological: Negative for dizziness, seizures, speech difficulty, numbness and paresthesias.  Psychiatric/Behavioral: Negative for hallucinations and confusion.    Allergies  Other and Penicillins  Home Medications   Current Outpatient Rx  Name Route Sig Dispense Refill  . ACETAMINOPHEN 500 MG PO TABS Oral Take 1,000 mg by mouth every 6 (six) hours as needed. For pain    . BACLOFEN 10 MG PO TABS Oral Take 1 tablet (10 mg total) by mouth 3 (three) times daily. 21 each 0  . HYDROCODONE-ACETAMINOPHEN 7.5-325 MG PO TABS Oral Take 1 tablet by mouth every 4 (four) hours as needed for pain. 20 tablet 0  . IBUPROFEN 800 MG PO TABS Oral Take 1 tablet (800 mg total) by mouth 3 (three) times daily. 21 tablet 0    BP 117/77  Pulse 87  Temp(Src) 97.8 F (36.6 C) (Oral)  Resp 20  Ht 5\' 6"  (1.676 m)  Wt 210 lb (95.255 kg)  BMI 33.89 kg/m2  SpO2 100%  Physical Exam  Nursing note and vitals reviewed. Constitutional: She is oriented to person, place, and time. She appears well-developed and well-nourished.  Non-toxic appearance.  HENT:  Head: Normocephalic.  Right Ear: Tympanic membrane and external ear normal.  Left Ear: Tympanic membrane and external ear normal.  Eyes: EOM and lids are normal. Pupils are equal,  round, and reactive to light.  Neck: Normal range of motion. Neck supple. Carotid bruit is not present.  Cardiovascular: Normal rate, regular rhythm, normal heart sounds, intact distal pulses and normal pulses.   Pulmonary/Chest: Breath sounds normal. No respiratory distress.       No rib area pain.  Abdominal: Soft. Bowel sounds are normal. There is no tenderness. There is no guarding.  Musculoskeletal:       There is muscle tightness and spasm at the lower lumbar area. There is pain and mild bruising of the top of the buttocks area. There is  minimal soreness over the coccyx area.  Lymphadenopathy:       Head (right side): No submandibular adenopathy present.       Head (left side): No submandibular adenopathy present.    She has no cervical adenopathy.  Neurological: She is alert and oriented to person, place, and time. She has normal strength. No cranial nerve deficit or sensory deficit. She exhibits normal muscle tone. Coordination normal.  Skin: Skin is warm and dry.  Psychiatric: She has a normal mood and affect. Her speech is normal.    ED Course  Procedures (including critical care time)  Labs Reviewed - No data to display No results found.   1. Contusion, back   2. Lumbar strain       MDM  I have reviewed nursing notes, vital signs, and all appropriate lab and imaging results for this patient. Patient sustained a fall when she stumbled over boxes and fell backwards. She now has pain and bruising of the lower back and buttocks area. There's been no loss of bowel or bladder function. Prescription for baclofen 10 mg, Norco 7.5 mg, and ibuprofen 800 mg given. Patient is to use ice pack to the area tonight and then alternate heat and ice for comfort. Patient is advised to return to the emergency department if not improving.       Kathie Dike, [place] 12/25/11 1450

## 2011-12-25 NOTE — ED Provider Notes (Signed)
Medical screening examination/treatment/procedure(s) were performed by non-physician practitioner and as supervising physician I was immediately available for consultation/collaboration. Haniah Penny, MD, FACEP   Emry Barbato L Ameerah Huffstetler, MD 12/25/11 1631 

## 2011-12-25 NOTE — ED Notes (Signed)
Pt DC to home with steady gait.  Ride waiting in [place]

## 2012-12-24 ENCOUNTER — Encounter (HOSPITAL_COMMUNITY): Payer: Self-pay | Admitting: *Deleted

## 2012-12-24 ENCOUNTER — Emergency Department (HOSPITAL_COMMUNITY)
Admission: EM | Admit: 2012-12-24 | Discharge: 2012-12-25 | Disposition: A | Discharge status: Home / Self Care | Payer: Medicaid Other | Attending: Emergency Medicine | Admitting: Emergency Medicine

## 2012-12-24 DIAGNOSIS — Z79899 Other long term (current) drug therapy: Secondary | ICD-10-CM | POA: Insufficient documentation

## 2012-12-24 DIAGNOSIS — Z862 Personal history of diseases of the blood and blood-forming organs and certain disorders involving the immune mechanism: Secondary | ICD-10-CM | POA: Insufficient documentation

## 2012-12-24 DIAGNOSIS — Z8639 Personal history of other endocrine, nutritional and metabolic disease: Secondary | ICD-10-CM | POA: Insufficient documentation

## 2012-12-24 DIAGNOSIS — Z8719 Personal history of other diseases of the digestive system: Secondary | ICD-10-CM | POA: Insufficient documentation

## 2012-12-24 DIAGNOSIS — R112 Nausea with vomiting, unspecified: Secondary | ICD-10-CM | POA: Insufficient documentation

## 2012-12-24 DIAGNOSIS — Z8669 Personal history of other diseases of the nervous system and sense organs: Secondary | ICD-10-CM | POA: Insufficient documentation

## 2012-12-24 DIAGNOSIS — R1013 Epigastric pain: Secondary | ICD-10-CM | POA: Insufficient documentation

## 2012-12-24 DIAGNOSIS — Z9089 Acquired absence of other organs: Secondary | ICD-10-CM | POA: Insufficient documentation

## 2012-12-24 DIAGNOSIS — Z9851 Tubal ligation status: Secondary | ICD-10-CM | POA: Insufficient documentation

## 2012-12-24 MED ORDER — SUCRALFATE 1 GM/10ML PO SUSP
1.0000 g | Freq: Once | ORAL | Status: AC
  Administered 2012-12-24: 1 g via ORAL
  Filled 2012-12-24: qty 10

## 2012-12-24 MED ORDER — PROMETHAZINE HCL 25 MG PO TABS: 25.0000 mg | ORAL_TABLET | Freq: Four times a day (QID) | ORAL | Status: DC | PRN

## 2012-12-24 MED ORDER — HYDROCODONE-ACETAMINOPHEN 5-325 MG PO TABS: 1.0000 | ORAL_TABLET | ORAL | Status: DC | PRN

## 2012-12-24 MED ORDER — HYDROCODONE-ACETAMINOPHEN 5-325 MG PO TABS
1.0000 | ORAL_TABLET | Freq: Once | ORAL | Status: AC
  Administered 2012-12-25: 1 via ORAL
  Filled 2012-12-24: qty 1

## 2012-12-24 MED ORDER — SUCRALFATE 1 G PO TABS
ORAL_TABLET | ORAL | Status: AC
  Filled 2012-12-24: qty 1

## 2012-12-24 NOTE — ED Notes (Signed)
Pt has mid upper abd pain x1 week, was seen 3 days ago at [facility] and given CT scan and [place]. States they did not show anything and she was discharged. Waiting on results to images from [facility].

## 2012-12-24 NOTE — ED Notes (Signed)
Upper abd pain, Recent adm to [facility] for similar sx and told she had reflux after having ct scan and U/S  Released from [facility] on Saturday 3/8

## 2012-12-24 NOTE — ED Notes (Signed)
Burgess Amor PA in room assessing pt at this time

## 2012-12-25 ENCOUNTER — Encounter: Payer: Self-pay | Admitting: Gastroenterology

## 2012-12-25 ENCOUNTER — Ambulatory Visit (INDEPENDENT_AMBULATORY_CARE_PROVIDER_SITE_OTHER): Payer: Medicaid Other | Admitting: Gastroenterology

## 2012-12-25 VITALS — BP 123/86 | HR 106 | Temp 98.2°F | Ht 65.0 in | Wt 200.6 lb

## 2012-12-25 DIAGNOSIS — R1011 Right upper quadrant pain: Secondary | ICD-10-CM

## 2012-12-25 LAB — HEPATIC FUNCTION PANEL
Albumin: 4.2 g/dL (ref 3.5–5.2)
Total Protein: 8.3 g/dL (ref 6.0–8.3)

## 2012-12-25 LAB — CBC
Hemoglobin: 12.5 g/dL (ref 12.0–15.0)
RBC: 4.95 MIL/uL (ref 3.87–5.11)

## 2012-12-25 LAB — BASIC METABOLIC PANEL
CO2: 27 mEq/L (ref 19–32)
Glucose, Bld: 94 mg/dL (ref 70–99)
Potassium: 4 mEq/L (ref 3.5–5.3)
Sodium: 138 mEq/L (ref 135–145)

## 2012-12-25 LAB — LIPASE: Lipase: 50 U/L (ref 11–59)

## 2012-12-25 NOTE — Progress Notes (Signed)
No PCP on file 

## 2012-12-25 NOTE — ED Provider Notes (Signed)
Medical screening examination/treatment/procedure(s) were performed by non-physician practitioner and as supervising physician I was immediately available for consultation/collaboration.   Charles B. Bernette Mayers, MD 12/25/12 1610

## 2012-12-25 NOTE — ED Notes (Signed)
Pt reporting being severe pain and angry that she isn't given more than one Vicodin tonight.  Pt laughing, texting and talking with friends.

## 2012-12-25 NOTE — Progress Notes (Signed)
Quick Note:  I contacted patient and informed of overall unimpressive results. LFTs, renal function look good. No anemia. Mild leukocytosis and thrombocytosis. Question reactive. Appears her white count has been mildly elevated in the past as well. When I contacted her, she sounded upbeat and not in distress as she had seemed in clinic. However, she states she is still experiencing the same pain and nausea. Plans remain for EGD on 3/12. Consider HIDA scan if EGD unrevealing. ______

## 2012-12-25 NOTE — Progress Notes (Signed)
Primary Care Physician:  No primary provider on file. Primary Gastroenterologist:  Dr. Rourk   Chief Complaint  Patient presents with  . Abdominal Pain    HPI:   [name] is a 32-year-old female presenting today at the request of the emergency department at [place] secondary to abdominal pain. She was actually admitted to [facility] from dates 3/6 to 3/8 secondary to abdominal pain. Outside records reviewed with normal LFTs, normal BUN and Cr, Hgb 10.6 with a microcytic anemia, mild leukocytosis of 12.8, normal lipase. US of abdomen with slight contraction of the gallbladder but no stones or wall-thickening. Negative Murphy's sign. CT abd/pelvis with contrast was without abnormalities.  She notes acute onset of epigastric/RUQ pain last Thursday, worsening. No prior incidences of pain. Significant nausea, some vomiting. Difficulty eating/drinking due to pain. Tried a milkshake and fries from [employer], as she thought she could tolerate. States this was "not a good idea". Took her all day to drink 16 oz of water. Urine is clear yellow. Taking sips of water brings tears to eyes. Lost 7 lbs since Thursday. No NSAIDs, BC powders. Doesn't like taking medicine. No reflux. Feels SOB. +chills, then really hot. Sweating then starts to vomit. Hasn't been able to fill medications prescribed from ED for anti-emetics and narcotic.  Past Medical History  Diagnosis Date  . Muscular dystrophy   . Thyroid disease   . Hyperlipemia     Past Surgical History  Procedure Laterality Date  . Cesarean section      X3  . Tonsillectomy    . Appendectomy    . Foot surgery    . Tubal ligation      Current Outpatient Prescriptions  Medication Sig Dispense Refill  . HYDROcodone-acetaminophen (NORCO/VICODIN) 5-325 MG per tablet Take 1 tablet by mouth every 4 (four) hours as needed for pain.  20 tablet  0  . promethazine (PHENERGAN) 25 MG tablet Take 1 tablet (25 mg total) by mouth every 6 (six) hours as  needed for nausea.  12 tablet  0   No current facility-administered medications for this visit.    Allergies as of 12/25/2012 - Review Complete 12/25/2012  Allergen Reaction Noted  . Other Nausea And Vomiting 12/25/2011  . Penicillins Other (See Comments)     Family History  Problem Relation Age of Onset  . Colon cancer Neg Hx     History   Social History  . Marital Status: Divorced    Spouse Name: N/A    Number of Children: N/A  . Years of Education: N/A   Occupational History  . employed     [employer]   Social History Main Topics  . Smoking status: Never Smoker   . Smokeless tobacco: Not on file  . Alcohol Use: No  . Drug Use: No  . Sexually Active: Yes    Birth Control/ Protection: Surgical   Other Topics Concern  . Not on file   Social History Narrative  . No narrative on file    Review of Systems: Gen: SEE HPI CV: Denies chest pain, heart palpitations, peripheral edema, syncope.  Resp: Denies shortness of breath at rest or with exertion. Denies wheezing or cough.  GI: SEE HPI GU : Denies urinary burning, urinary frequency, urinary hesitancy MS: Denies joint pain, muscle weakness, cramps, or limitation of movement.  Derm: Denies rash, itching, dry skin Psych: Denies depression, anxiety, memory loss, and confusion Heme: Denies bruising, bleeding, and enlarged lymph nodes.  Physical Exam: BP   123/86  Pulse 106  Temp(Src) 98.2 F (36.8 C) (Oral)  Ht 5' 5" (1.651 m)  Wt 200 lb 9.6 oz (90.992 kg)  BMI 33.38 kg/m2  LMP 12/15/2012 General:   Alert and oriented. In obvious discomfort.  Head:  Normocephalic and atraumatic. Eyes:  Without icterus, sclera clear and conjunctiva pink.  Ears:  Normal auditory acuity. Nose:  No deformity, discharge,  or lesions. Mouth:  No deformity or lesions, oral mucosa pink.  Neck:  Supple, without mass or thyromegaly. Lungs:  Clear to auscultation bilaterally. No wheezes, rales, or rhonchi. No distress.  Heart:   S1, S2 present without murmurs appreciated.  Abdomen:  +BS, soft, tender to palpation epigastric region and right upper quadrant. No rebound or guarding.   No HSM noted. No masses appreciated.  Rectal:  Deferred  Msk:  Symmetrical without gross deformities. Normal posture. Pulses:  Normal pulses noted. Neurologic:  Alert and  oriented x4;  grossly normal neurologically. Skin:  Intact without significant lesions or rashes. Cervical Nodes:  No significant cervical adenopathy. Psych:  Alert and cooperative. Normal mood and affect.    

## 2012-12-25 NOTE — Assessment & Plan Note (Addendum)
32 year old female with acute onset of epigastric and right upper quadrant pain last Thursday, resulting in admission to [facility] for evaluation. At that time, [place] of abdomen only showed a contracted gallbladder but no stones or evidence of cholecystitis. CT abd/pelvis with contrast was benign. Overall, blood work to include lipase, LFTs, BUN and Cr were all within normal limits. Microcytic anemia noted with mild leukocytosis (WBC 12.8). Likely anemia secondary to menstruation. No evidence of melena or rectal bleeding. She denies use of NSAIDs or aspirin powders. She has not had any medications filled due to finances, but she will be obtaining these today. No PPI currently. As CT and Korea are overall benign, she will need an EGD as soon as possible to evaluate for gastritis, PUD. I still question biliary disease. I would like to update CBC, LFTs, lipase, and BMP stat today. If any abnormalities, may warrant different procedure. However, we will plan on EGD 3/12 with Dr. Jena Gauss.  Proceed with upper endoscopy in the near future with Dr. Jena Gauss. The risks, benefits, and alternatives have been discussed in detail with patient. They have stated understanding and desire to proceed.  Labs stat today Start PPI (patient given Prilosec by other provider) To ED if worsens

## 2012-12-25 NOTE — ED Provider Notes (Signed)
History     CSN: [id number]  Arrival date & time 12/24/12  2147   First MD Initiated Contact with Patient 12/24/12 2202      Chief Complaint  Patient presents with  . Abdominal Pain    (Consider location/radiation/quality/duration/timing/severity/associated sxs/prior treatment) HPI Comments: [name] is a 32 y.o. Female presenting with epigastric pain, nausea and vomiting which has been waxing and waning for the past 4 days. She was seen at [facility] the day her symptoms began and was admitted for presumptive gallbladder disease.  However,  Her work up including an abdominal ultrasound and abdominal/pelvic Ct scans were negative.  She was discharged home with diagnosis of GERD.  She was prescribed carafate and prilosec which she has not filled yet.  Her symptoms have not improved.  She attempted to eat french fries and a strawberry milkshake at [employer],  And her symptoms worsened.  She denies fevers and chills.  She has had no emesis this evening. Her pain is burning,  Constant and non radiating.  She has increased burping without relief of symtpoms.    The history is provided by the patient and a relative.    Past Medical History  Diagnosis Date  . Muscular dystrophy   . Thyroid disease   . Hyperlipemia     Past Surgical History  Procedure Laterality Date  . Cesarean section    . Tonsillectomy    . Appendectomy    . Foot surgery    . Tubal ligation      History reviewed. No pertinent family history.  History  Substance Use Topics  . Smoking status: Never Smoker   . Smokeless tobacco: Not on file  . Alcohol Use: No    OB History   Grav Para Term Preterm Abortions TAB SAB Ect Mult Living                  Review of Systems  Constitutional: Negative for fever and chills.  HENT: Negative for congestion, sore throat and neck pain.   Eyes: Negative.   Respiratory: Negative for chest tightness and shortness of breath.   Cardiovascular:  Negative for chest pain.  Gastrointestinal: Positive for nausea, vomiting and abdominal pain. Negative for diarrhea.  Genitourinary: Negative.   Musculoskeletal: Negative for joint swelling and arthralgias.  Skin: Negative.  Negative for rash and wound.  Neurological: Negative for dizziness, weakness, light-headedness, numbness and headaches.  Psychiatric/Behavioral: Negative.     Allergies  Other and Penicillins  Home Medications   Current Outpatient Rx  Name  Route  Sig  Dispense  Refill  . HYDROcodone-acetaminophen (NORCO/VICODIN) 5-325 MG per tablet   Oral   Take 1 tablet by mouth every 4 (four) hours as needed for pain.   20 tablet   0   . promethazine (PHENERGAN) 25 MG tablet   Oral   Take 1 tablet (25 mg total) by mouth every 6 (six) hours as needed for nausea.   12 tablet   0     BP 99/63  Pulse 113  Temp(Src) 97.4 F (36.3 C) (Oral)  Resp 18  Ht 5\' 5"  (1.651 m)  Wt 207 lb (93.895 kg)  BMI 34.45 kg/m2  SpO2 100%  LMP 12/15/2012  Physical Exam  Nursing note and vitals reviewed. Constitutional: She appears well-developed and well-nourished.  HENT:  Head: Normocephalic and atraumatic.  Eyes: Conjunctivae are normal.  Neck: Normal range of motion.  Cardiovascular: Normal rate, regular rhythm, normal heart sounds and intact  distal pulses.   Pulmonary/Chest: Effort normal and breath sounds normal. She has no wheezes.  Abdominal: Soft. Bowel sounds are normal. There is no hepatosplenomegaly. There is tenderness in the epigastric area. There is no rigidity, no rebound, no guarding and no CVA tenderness.  Epigastric ttp without guard or rebound.  Musculoskeletal: Normal range of motion.  Neurological: She is alert.  Skin: Skin is warm and dry.  Psychiatric: She has a normal mood and affect.    ED Course  Procedures (including critical care time)  Labs Reviewed - No data to display No results found.   1. Epigastric pain       MDM  Records from  [facility] were reviewed including Ct, [place],  Labs and hospital course which were unremarkable.  Discussed with Dr Bernette Mayers prior to dc home.  Pt with probable GERD, non compliant with recent diagnosis and recommended meds. She was given carafate with mild improved sx.   Encouraged to get carafate and prilosec filled. Prescribed small amount of hydrocodone and phenergan tonight.  She was referred to Dr. Darrick Penna for further eval. Patient may have gerd which could be evaluated by endoscopy.  Also may consider HIDA scan to further assess for gallbladder disfunction. Pt encouraged to avoid greasy , spicy foods.  Abdominal exam is benign.  The patient appears reasonably screened and/or stabilized for discharge and I doubt any other medical condition or other [facility] requiring further screening, evaluation, or treatment in the ED at this time prior to discharge.         Burgess Amor, PA-C 12/25/12 0031  Burgess Amor, PA-C 12/25/12 1610

## 2012-12-25 NOTE — Patient Instructions (Addendum)
Please have blood work done when you leave here. I have ordered it STAT so that the results may come back as soon as possible.  We will call you with the results. For now, we plan on an upper endoscopy with Dr. Jena Gauss tomorrow. However, if there are any abnormalities in the blood work, we may need to go a different route.

## 2012-12-26 ENCOUNTER — Other Ambulatory Visit: Payer: Self-pay | Admitting: Internal Medicine

## 2012-12-26 ENCOUNTER — Encounter (HOSPITAL_COMMUNITY): Payer: Self-pay | Admitting: *Deleted

## 2012-12-26 ENCOUNTER — Encounter (HOSPITAL_COMMUNITY)
Admission: RE | Disposition: A | Discharge status: Home / Self Care | Payer: Self-pay | Source: Ambulatory Visit | Attending: Internal Medicine

## 2012-12-26 ENCOUNTER — Ambulatory Visit (HOSPITAL_COMMUNITY)
Admission: RE | Admit: 2012-12-26 | Discharge: 2012-12-26 | Disposition: A | Discharge status: Home / Self Care | Payer: Medicaid Other | Source: Ambulatory Visit | Attending: Internal Medicine | Admitting: Internal Medicine

## 2012-12-26 DIAGNOSIS — R1011 Right upper quadrant pain: Secondary | ICD-10-CM | POA: Insufficient documentation

## 2012-12-26 DIAGNOSIS — K2980 Duodenitis without bleeding: Secondary | ICD-10-CM | POA: Insufficient documentation

## 2012-12-26 DIAGNOSIS — K449 Diaphragmatic hernia without obstruction or gangrene: Secondary | ICD-10-CM | POA: Insufficient documentation

## 2012-12-26 DIAGNOSIS — R112 Nausea with vomiting, unspecified: Secondary | ICD-10-CM | POA: Insufficient documentation

## 2012-12-26 DIAGNOSIS — R933 Abnormal findings on diagnostic imaging of other parts of digestive tract: Secondary | ICD-10-CM

## 2012-12-26 DIAGNOSIS — R1013 Epigastric pain: Secondary | ICD-10-CM | POA: Insufficient documentation

## 2012-12-26 DIAGNOSIS — E785 Hyperlipidemia, unspecified: Secondary | ICD-10-CM | POA: Insufficient documentation

## 2012-12-26 DIAGNOSIS — Z880 Allergy status to penicillin: Secondary | ICD-10-CM | POA: Insufficient documentation

## 2012-12-26 DIAGNOSIS — E079 Disorder of thyroid, unspecified: Secondary | ICD-10-CM | POA: Insufficient documentation

## 2012-12-26 DIAGNOSIS — R109 Unspecified abdominal pain: Secondary | ICD-10-CM

## 2012-12-26 DIAGNOSIS — Z888 Allergy status to other drugs, medicaments and biological substances status: Secondary | ICD-10-CM | POA: Insufficient documentation

## 2012-12-26 DIAGNOSIS — G7109 Other specified muscular dystrophies: Secondary | ICD-10-CM | POA: Insufficient documentation

## 2012-12-26 HISTORY — PX: ESOPHAGOGASTRODUODENOSCOPY: SHX5428

## 2012-12-26 SURGERY — EGD (ESOPHAGOGASTRODUODENOSCOPY)
Anesthesia: Moderate Sedation

## 2012-12-26 MED ORDER — BUTAMBEN-TETRACAINE-BENZOCAINE 2-2-14 % EX AERO
INHALATION_SPRAY | CUTANEOUS | Status: DC | PRN
  Administered 2012-12-26: 2 via TOPICAL

## 2012-12-26 MED ORDER — MEPERIDINE HCL 100 MG/ML IJ SOLN
INTRAMUSCULAR | Status: DC | PRN
  Administered 2012-12-26 (×2): 50 mg via INTRAVENOUS

## 2012-12-26 MED ORDER — MIDAZOLAM HCL 5 MG/5ML IJ SOLN
INTRAMUSCULAR | Status: DC | PRN
  Administered 2012-12-26 (×2): 2 mg via INTRAVENOUS

## 2012-12-26 MED ORDER — ONDANSETRON HCL 4 MG/2ML IJ SOLN
INTRAMUSCULAR | Status: DC | PRN
  Administered 2012-12-26: 4 mg via INTRAVENOUS

## 2012-12-26 MED ORDER — ONDANSETRON HCL 4 MG/2ML IJ SOLN
INTRAMUSCULAR | Status: AC
  Filled 2012-12-26: qty 2

## 2012-12-26 MED ORDER — PROMETHAZINE HCL 25 MG/ML IJ SOLN
INTRAMUSCULAR | Status: AC
  Filled 2012-12-26: qty 1

## 2012-12-26 MED ORDER — SODIUM CHLORIDE 0.9 % IJ SOLN
INTRAMUSCULAR | Status: AC
  Filled 2012-12-26: qty 10

## 2012-12-26 MED ORDER — SODIUM CHLORIDE 0.45 % IV SOLN
INTRAVENOUS | Status: DC
  Administered 2012-12-26: 10:00:00 via INTRAVENOUS

## 2012-12-26 MED ORDER — MIDAZOLAM HCL 5 MG/5ML IJ SOLN
INTRAMUSCULAR | Status: AC
  Filled 2012-12-26: qty 10

## 2012-12-26 MED ORDER — MEPERIDINE HCL 100 MG/ML IJ SOLN
INTRAMUSCULAR | Status: AC
  Filled 2012-12-26: qty 2

## 2012-12-26 MED ORDER — PROMETHAZINE HCL 25 MG/ML IJ SOLN
25.0000 mg | Freq: Once | INTRAMUSCULAR | Status: AC
  Administered 2012-12-26: 25 mg via INTRAVENOUS

## 2012-12-26 MED ORDER — STERILE WATER FOR IRRIGATION IR SOLN
Status: DC | PRN
  Administered 2012-12-26: 11:00:00

## 2012-12-26 NOTE — Interval H&P Note (Signed)
History and Physical Interval Note:  12/26/2012 11:01 AM  [name]  has presented today for surgery, with the diagnosis of RUQ Pain  The various methods of treatment have been discussed with the patient and family. After consideration of risks, benefits and other options for treatment, the patient has consented to  Procedure(s) with comments: ESOPHAGOGASTRODUODENOSCOPY (EGD) (N/A) - 12:45-moved to 1100 [address] Ann notified pt as a surgical intervention .  The patient's history has been reviewed, patient examined, no change in status, stable for surgery.  I have reviewed the patient's chart and labs.  Questions were answered to the patient's satisfaction.     Robert Rourk  No change. EGD per plan.The risks, benefits, limitations, alternatives and imponderables have been reviewed with the patient. Potential for esophageal dilation, biopsy, etc. have also been reviewed.  Questions have been answered. All parties agreeable.

## 2012-12-26 NOTE — H&P (View-Only) (Signed)
Primary Care Physician:  No primary Carnelia Oscar on file. Primary Gastroenterologist:  Dr. Jena Gauss   Chief Complaint  Patient presents with  . Abdominal Pain    HPI:   Ms. [name] is a 32 year old female presenting today at the request of the emergency department at [facility] secondary to abdominal pain. She was actually admitted to [facility] from dates 3/6 to 3/8 secondary to abdominal pain. Outside records reviewed with normal LFTs, normal BUN and Cr, Hgb 10.6 with a microcytic anemia, mild leukocytosis of 12.8, normal lipase. [place] of abdomen with slight contraction of the gallbladder but no stones or wall-thickening. Negative Murphy's sign. CT abd/pelvis with contrast was without abnormalities.  She notes acute onset of epigastric/RUQ pain last Thursday, worsening. No prior incidences of pain. Significant nausea, some vomiting. Difficulty eating/drinking due to pain. Tried a milkshake and fries from [employer], as she thought she could tolerate. States this was "not a good idea". Took her all day to drink 16 oz of water. Urine is clear yellow. Taking sips of water brings tears to eyes. Lost 7 lbs since Thursday. No NSAIDs, BC powders. Doesn't like taking medicine. No reflux. Feels SOB. +chills, then really hot. Sweating then starts to vomit. Hasn't been able to fill medications prescribed from ED for anti-emetics and narcotic.  Past Medical History  Diagnosis Date  . Muscular dystrophy   . Thyroid disease   . Hyperlipemia     Past Surgical History  Procedure Laterality Date  . Cesarean section      X3  . Tonsillectomy    . Appendectomy    . Foot surgery    . Tubal ligation      Current Outpatient Prescriptions  Medication Sig Dispense Refill  . HYDROcodone-acetaminophen (NORCO/VICODIN) 5-325 MG per tablet Take 1 tablet by mouth every 4 (four) hours as needed for pain.  20 tablet  0  . promethazine (PHENERGAN) 25 MG tablet Take 1 tablet (25 mg total) by mouth every 6 (six) hours as  needed for nausea.  12 tablet  0   No current facility-administered medications for this visit.    Allergies as of 12/25/2012 - Review Complete 12/25/2012  Allergen Reaction Noted  . Other Nausea And Vomiting 12/25/2011  . Penicillins Other (See Comments)     Family History  Problem Relation Age of Onset  . Colon cancer Neg Hx     History   Social History  . Marital Status: Divorced    Spouse Name: N/A    Number of Children: N/A  . Years of Education: N/A   Occupational History  . employed     [employer]   Social History Main Topics  . Smoking status: Never Smoker   . Smokeless tobacco: Not on file  . Alcohol Use: No  . Drug Use: No  . Sexually Active: Yes    Birth Control/ Protection: Surgical   Other Topics Concern  . Not on file   Social History Narrative  . No narrative on file    Review of Systems: Gen: SEE HPI CV: Denies chest pain, heart palpitations, peripheral edema, syncope.  Resp: Denies shortness of breath at rest or with exertion. Denies wheezing or cough.  GI: SEE HPI GU : Denies urinary burning, urinary frequency, urinary hesitancy MS: Denies joint pain, muscle weakness, cramps, or limitation of movement.  Derm: Denies rash, itching, dry skin Psych: Denies depression, anxiety, memory loss, and confusion Heme: Denies bruising, bleeding, and enlarged lymph nodes.  Physical Exam: BP  123/86  Pulse 106  Temp(Src) 98.2 F (36.8 C) (Oral)  Ht 5\' 5"  (1.651 m)  Wt 200 lb 9.6 oz (90.992 kg)  BMI 33.38 kg/m2  LMP 12/15/2012 General:   Alert and oriented. In obvious discomfort.  Head:  Normocephalic and atraumatic. Eyes:  Without icterus, sclera clear and conjunctiva pink.  Ears:  Normal auditory acuity. Nose:  No deformity, discharge,  or lesions. Mouth:  No deformity or lesions, oral mucosa pink.  Neck:  Supple, without mass or thyromegaly. Lungs:  Clear to auscultation bilaterally. No wheezes, rales, or rhonchi. No distress.  Heart:   S1, S2 present without murmurs appreciated.  Abdomen:  +BS, soft, tender to palpation epigastric region and right upper quadrant. No rebound or guarding.   No HSM noted. No masses appreciated.  Rectal:  Deferred  Msk:  Symmetrical without gross deformities. Normal posture. Pulses:  Normal pulses noted. Neurologic:  Alert and  oriented x4;  grossly normal neurologically. Skin:  Intact without significant lesions or rashes. Cervical Nodes:  No significant cervical adenopathy. Psych:  Alert and cooperative. Normal mood and affect.

## 2012-12-26 NOTE — Op Note (Signed)
[facility] [address] [place], [address]   ENDOSCOPY PROCEDURE REPORT  PATIENT: [name], [name]  MR#: [id number] BIRTHDATE: Jan 05, 1981 , 32  yrs. old GENDER: Female ENDOSCOPIST: Jonathon Bellows, MD FACP [facility] REFERRED BY: PROCEDURE DATE:  12/26/2012 PROCEDURE:     EGD with duodenal biopsy  INDICATIONS:     New onset upper abdominal pain. Negative CT, gallbladder ultrasound and labs  INFORMED CONSENT:   The risks, benefits, limitations, alternatives and imponderables have been discussed.  The potential for biopsy, esophogeal dilation, etc. have also been reviewed.  Questions have been answered.  All parties agreeable.  Please see the history and physical in the medical record for more information.  MEDICATIONS:    Versed 4 mg IV and Demerol 100 mg IV in divided doses. Zofran 4 mg IV. Cetacaine spray. Phenergan 25 mg  DESCRIPTION OF PROCEDURE:   The NW-[id number] ([id number])  endoscope was introduced through the mouth and advanced to the second portion of the duodenum without difficulty or limitations.  The mucosal surfaces were surveyed very carefully during advancement of the scope and upon withdrawal.  Retroflexion view of the proximal stomach and esophagogastric junction was performed.      FINDINGS: Normal esophagus. Stomach empty. Small hiatal hernia. Normal gastric mucosa. Patent pylorus. Examination first, second and third portion of duodenum revealed minimal scalloping of the duodenal fold tips.  THERAPEUTIC / DIAGNOSTIC MANEUVERS PERFORMED:  Biopsies of the second and third portion of the duodenum taken for histologic study.   COMPLICATIONS:  None  IMPRESSION:   Small hiatal hernia. Subtly abnormal duodenal mucosa of uncertain clinical significance-status post biopsy (this would not explain patient's recent abdominal pain, however).  RECOMMENDATIONS:   Followup on pathology.  Proceed with HIDA/CCK challenge to further evaluate for occult  gallbladder disease.    _______________________________ R. Roetta Sessions, MD FACP [facility] eSigned:  R. Roetta Sessions, MD FACP [facility] 12/26/2012 11:30 AM     CC:

## 2012-12-27 ENCOUNTER — Encounter (HOSPITAL_COMMUNITY): Payer: Self-pay

## 2012-12-27 ENCOUNTER — Encounter (HOSPITAL_COMMUNITY)
Admit: 2012-12-27 | Discharge: 2012-12-27 | Disposition: A | Discharge status: Home / Self Care | Payer: Medicaid Other | Source: Ambulatory Visit | Attending: Internal Medicine | Admitting: Internal Medicine

## 2012-12-27 DIAGNOSIS — R1011 Right upper quadrant pain: Secondary | ICD-10-CM | POA: Insufficient documentation

## 2012-12-27 DIAGNOSIS — R110 Nausea: Secondary | ICD-10-CM | POA: Insufficient documentation

## 2012-12-27 DIAGNOSIS — R932 Abnormal findings on diagnostic imaging of liver and biliary tract: Secondary | ICD-10-CM | POA: Insufficient documentation

## 2012-12-27 MED ORDER — TECHNETIUM TC 99M MEBROFENIN IV KIT
5.0000 | PACK | Freq: Once | INTRAVENOUS | Status: AC | PRN
  Administered 2012-12-27: 5.5 via INTRAVENOUS

## 2012-12-28 ENCOUNTER — Encounter: Payer: Self-pay | Admitting: Internal Medicine

## 2012-12-28 ENCOUNTER — Other Ambulatory Visit: Payer: Self-pay | Admitting: Internal Medicine

## 2012-12-28 ENCOUNTER — Encounter: Payer: Self-pay | Admitting: *Deleted

## 2012-12-28 ENCOUNTER — Other Ambulatory Visit: Payer: Self-pay

## 2012-12-28 DIAGNOSIS — R1011 Right upper quadrant pain: Secondary | ICD-10-CM

## 2012-12-28 NOTE — Progress Notes (Signed)
Referral has been faxed to Dr. Jarvis Newcomer office

## 2013-01-01 ENCOUNTER — Encounter (HOSPITAL_COMMUNITY): Payer: Self-pay | Admitting: Internal Medicine

## 2013-01-01 ENCOUNTER — Encounter (HOSPITAL_COMMUNITY): Payer: Self-pay | Admitting: Pharmacy Technician

## 2013-01-01 NOTE — H&P (Signed)
NTS SOAP Note  Vital Signs:  Vitals as of: 01/01/2013: Systolic 30: Diastolic 90: Heart Rate 74: Temp 107F: Height 12ft 5in: Weight 206Lbs 0 Ounces: Pain Level 10: BMI 34.28  BMI : 34.28 kg/m2  Subjective: This 32 Years 15 Months old Female presents for of    ABDOMINAL PAIN : ,Has been having intermittent right upper quadrant abdominal pain, nausea, bloating, and fatty food intolerance for the past few weeks.  Was hospitalized at [facility].  Told it was reflux.  EGD by Dr. Jena Gauss negative.  HIDA reveals low gallbladder ejection fraction with reproducible symptoms with CCK.  No fever, chills, jaundice.  Review of Symptoms:  Constitutional:unremarkable   Head:unremarkable    Eyes:unremarkable   Nose/Mouth/Throat:unremarkable Cardiovascular:  unremarkable   Respiratory:unremarkable       as above Genitourinary:unremarkable     Musculoskeletal:unremarkable   Skin:unremarkable Hematolgic/Lymphatic:unremarkable     Allergic/Immunologic:unremarkable     Past Medical History:    Reviewed   Past Medical History  Surgical History: c-sections, BTL, appendectomy Medical Problems: none Allergies: pcn, walnuts Medications: none   Social History:Reviewed  Social History  Preferred Language: English Race:  Other Ethnicity: Not Hispanic / Latino Age: 32 Years 0 Months Marital Status:  S Alcohol: rarely Recreational drug(s):  No   Smoking Status: Never smoker reviewed on 01/01/2013 Functional Status reviewed on mm/dd/yyyy ------------------------------------------------ Bathing: Normal Cooking: Normal Dressing: Normal Driving: Normal Eating: Normal Managing Meds: Normal Oral Care: Normal Shopping: Normal Toileting: Normal Transferring: Normal Walking: Normal Cognitive Status reviewed on mm/dd/yyyy ------------------------------------------------ Attention: Normal Decision Making: Normal Language: Normal Memory: Normal Motor:  Normal Perception: Normal Problem Solving: Normal Visual and Spatial: Normal   Family History:  Reviewed   Family History  Is there a family history VH:QIONGEX problems, DM    Objective Information: General:  Well appearing, well nourished in no distress.   no scleral icterus Heart:  RRR, no murmur Lungs:    CTA bilaterally, no wheezes, rhonchi, rales.  Breathing unlabored. Abdomen:Soft, tender in right upper quadrant to palpation, ND, no HSM, no masses.  Assessment:Chronic cholecystitis  Diagnosis &amp; Procedure Smart Code   Plan:Scheduled for laparoscopic cholecystectomy on 01/04/13.   Patient Education:Alternative treatments to surgery were discussed with patient (and family).  Risks and benefits  of procedure including bleeding, infection, hepatobiliary injury, and the possibility of an open procedure were fully explained to the patient (and family) who gave informed consent. Patient/family questions were addressed.  Follow-up:Pending Surgery

## 2013-01-02 NOTE — Patient Instructions (Addendum)
[name]  01/02/2013   Your procedure is scheduled on:  01/04/2013  Report to [facility] at  730  AM.  Call this number if you have problems the morning of surgery: [phone]   Remember:   Do not eat food or drink liquids after midnight.   Take these medicines the morning of surgery with A SIP OF WATER: prilosec,percocet   Do not wear jewelry, make-up or nail polish.  Do not wear lotions, powders, or perfumes.  Do not shave 48 hours prior to surgery. Men may shave face and neck.  Do not bring valuables to the hospital.  Contacts, dentures or bridgework may not be worn into surgery.  Leave suitcase in the car. After surgery it may be brought to your room.  For patients admitted to the hospital, checkout time is 11:00 AM the day of discharge.   Patients discharged the day of surgery will not be allowed to drive  home.  Name and phone number of your driver: family  Special Instructions: Shower using CHG 2 nights before surgery and the night before surgery.  If you shower the day of surgery use CHG.  Use special wash - you have one bottle of CHG for all showers.  You should use approximately 1/3 of the bottle for each shower.   Please read over the following fact sheets that you were given: Pain Booklet, Coughing and Deep Breathing, MRSA Information, Surgical Site Infection Prevention, Anesthesia Post-op Instructions and Care and Recovery After Surgery Laparoscopic Cholecystectomy Laparoscopic cholecystectomy is surgery to remove the gallbladder. The gallbladder is located slightly to the right of center in the abdomen, behind the liver. It is a concentrating and storage sac for the bile produced in the liver. Bile aids in the digestion and absorption of fats. Gallbladder disease (cholecystitis) is an inflammation of your gallbladder. This condition is usually caused by a buildup of gallstones (cholelithiasis) in your gallbladder. Gallstones can block the flow of bile, resulting in  inflammation and pain. In severe cases, emergency surgery may be required. When emergency surgery is not required, you will have time to prepare for the procedure. Laparoscopic surgery is an alternative to open surgery. Laparoscopic surgery usually has a shorter recovery time. Your common bile duct may also need to be examined and explored. Your caregiver will discuss this with you if he or she feels this should be done. If stones are found in the common bile duct, they may be removed. LET YOUR CAREGIVER KNOW ABOUT:  Allergies to food or medicine.  Medicines taken, including vitamins, herbs, eyedrops, over-the-counter medicines, and creams.  Use of steroids (by mouth or creams).  Previous problems with anesthetics or numbing medicines.  History of bleeding problems or blood clots.  Previous surgery.  Other health problems, including diabetes and kidney problems.  Possibility of pregnancy, if this applies. RISKS AND COMPLICATIONS All surgery is associated with risks. Some problems that may occur following this procedure include:  Infection.  Damage to the common bile duct, nerves, arteries, veins, or other internal organs such as the stomach or intestines.  Bleeding.  A stone may remain in the common bile duct. BEFORE THE PROCEDURE  Do not take aspirin for 3 days prior to surgery or blood thinners for 1 week prior to surgery.  Do not eat or drink anything after midnight the night before surgery.  Let your caregiver know if you develop a cold or other infectious problem prior to surgery.  You  should be present 60 minutes before the procedure or as directed. PROCEDURE  You will be given medicine that makes you sleep (general anesthetic). When you are asleep, your surgeon will make several small cuts (incisions) in your abdomen. One of these incisions is used to insert a small, lighted scope (laparoscope) into the abdomen. The laparoscope helps the surgeon see into your abdomen.  Carbon dioxide gas will be pumped into your abdomen. The gas allows more room for the surgeon to perform your surgery. Other operating instruments are inserted through the other incisions. Laparoscopic procedures may not be appropriate when:  There is major scarring from previous surgery.  The gallbladder is extremely inflamed.  There are bleeding disorders or unexpected cirrhosis of the liver.  A pregnancy is near term.  Other conditions make the laparoscopic procedure impossible. If your surgeon feels it is not safe to continue with a laparoscopic procedure, he or she will perform an open abdominal procedure. In this case, the surgeon will make an incision to open the abdomen. This gives the surgeon a larger view and field to work within. This may allow the surgeon to perform procedures that sometimes cannot be performed with a laparoscope alone. Open surgery has a longer recovery time. AFTER THE PROCEDURE  You will be taken to the recovery area where a nurse will watch and check your progress.  You may be allowed to go home the same day.  Do not resume physical activities until directed by your caregiver.  You may resume a normal diet and activities as directed. Document Released: 10/03/2005 Document Revised: 12/26/2011 Document Reviewed: 03/18/2011 [facility] Patient Information 2013 [place], [place]. PATIENT INSTRUCTIONS POST-ANESTHESIA  IMMEDIATELY FOLLOWING SURGERY:  Do not drive or operate machinery for the first twenty four hours after surgery.  Do not make any important decisions for twenty four hours after surgery or while taking narcotic pain medications or sedatives.  If you develop intractable nausea and vomiting or a severe headache please notify your doctor immediately.  FOLLOW-UP:  Please make an appointment with your surgeon as instructed. You do not need to follow up with anesthesia unless specifically instructed to do so.  WOUND CARE INSTRUCTIONS (if applicable):  Keep  a dry clean dressing on the anesthesia/puncture wound site if there is drainage.  Once the wound has quit draining you may leave it open to air.  Generally you should leave the bandage intact for twenty four hours unless there is drainage.  If the epidural site drains for more than 36-48 hours please call the anesthesia department.  QUESTIONS?:  Please feel free to call your physician or the hospital operator if you have any questions, and they will be happy to assist you.

## 2013-01-03 ENCOUNTER — Encounter (HOSPITAL_COMMUNITY)
Admission: RE | Admit: 2013-01-03 | Discharge: 2013-01-03 | Disposition: A | Discharge status: Home / Self Care | Payer: Medicaid Other | Source: Ambulatory Visit | Attending: General Surgery | Admitting: General Surgery

## 2013-01-03 ENCOUNTER — Encounter (HOSPITAL_COMMUNITY): Payer: Self-pay

## 2013-01-04 ENCOUNTER — Ambulatory Visit (HOSPITAL_COMMUNITY): Payer: Medicaid Other | Admitting: Anesthesiology

## 2013-01-04 ENCOUNTER — Ambulatory Visit (HOSPITAL_COMMUNITY)
Admission: RE | Admit: 2013-01-04 | Discharge: 2013-01-04 | Disposition: A | Discharge status: Home / Self Care | Payer: Medicaid Other | Source: Ambulatory Visit | Attending: General Surgery | Admitting: General Surgery

## 2013-01-04 ENCOUNTER — Encounter (HOSPITAL_COMMUNITY)
Admission: RE | Disposition: A | Discharge status: Home / Self Care | Payer: Self-pay | Source: Ambulatory Visit | Attending: General Surgery

## 2013-01-04 ENCOUNTER — Encounter (HOSPITAL_COMMUNITY): Payer: Self-pay | Admitting: Anesthesiology

## 2013-01-04 ENCOUNTER — Encounter (HOSPITAL_COMMUNITY): Payer: Self-pay | Admitting: *Deleted

## 2013-01-04 DIAGNOSIS — K811 Chronic cholecystitis: Secondary | ICD-10-CM | POA: Insufficient documentation

## 2013-01-04 HISTORY — PX: CHOLECYSTECTOMY: SHX55

## 2013-01-04 SURGERY — LAPAROSCOPIC CHOLECYSTECTOMY
Anesthesia: General | Site: Abdomen | Wound class: Clean Contaminated

## 2013-01-04 MED ORDER — BUPIVACAINE HCL (PF) 0.5 % IJ SOLN
INTRAMUSCULAR | Status: DC | PRN
  Administered 2013-01-04: 10 mL

## 2013-01-04 MED ORDER — MUPIROCIN 2 % EX OINT
TOPICAL_OINTMENT | Freq: Two times a day (BID) | CUTANEOUS | Status: DC
  Administered 2013-01-04: 1 via NASAL

## 2013-01-04 MED ORDER — PROPOFOL 10 MG/ML IV BOLUS
INTRAVENOUS | Status: DC | PRN
  Administered 2013-01-04: 200 mg via INTRAVENOUS

## 2013-01-04 MED ORDER — PROPOFOL 10 MG/ML IV EMUL
INTRAVENOUS | Status: AC
  Filled 2013-01-04: qty 20

## 2013-01-04 MED ORDER — FENTANYL CITRATE 0.05 MG/ML IJ SOLN
INTRAMUSCULAR | Status: AC
  Filled 2013-01-04: qty 2

## 2013-01-04 MED ORDER — ROCURONIUM BROMIDE 50 MG/5ML IV SOLN
INTRAVENOUS | Status: AC
  Filled 2013-01-04: qty 1

## 2013-01-04 MED ORDER — HYDROMORPHONE HCL PF 1 MG/ML IJ SOLN: 0.5000 mg | INTRAMUSCULAR | Status: DC | PRN

## 2013-01-04 MED ORDER — ONDANSETRON HCL 4 MG/2ML IJ SOLN
4.0000 mg | Freq: Once | INTRAMUSCULAR | Status: AC
  Administered 2013-01-04: 4 mg via INTRAVENOUS

## 2013-01-04 MED ORDER — ONDANSETRON HCL 4 MG/2ML IJ SOLN
4.0000 mg | Freq: Once | INTRAMUSCULAR | Status: AC | PRN
  Administered 2013-01-04: 4 mg via INTRAVENOUS

## 2013-01-04 MED ORDER — KETOROLAC TROMETHAMINE 30 MG/ML IJ SOLN
INTRAMUSCULAR | Status: AC
  Filled 2013-01-04: qty 1

## 2013-01-04 MED ORDER — MUPIROCIN 2 % EX OINT
TOPICAL_OINTMENT | CUTANEOUS | Status: AC
  Filled 2013-01-04: qty 22

## 2013-01-04 MED ORDER — ONDANSETRON HCL 4 MG/2ML IJ SOLN
INTRAMUSCULAR | Status: AC
  Filled 2013-01-04: qty 2

## 2013-01-04 MED ORDER — CLINDAMYCIN PHOSPHATE 900 MG/50ML IV SOLN
INTRAVENOUS | Status: AC
  Filled 2013-01-04: qty 50

## 2013-01-04 MED ORDER — ENOXAPARIN SODIUM 40 MG/0.4ML ~~LOC~~ SOLN
SUBCUTANEOUS | Status: AC
  Filled 2013-01-04: qty 0.4

## 2013-01-04 MED ORDER — SODIUM CHLORIDE 0.9 % IJ SOLN
INTRAMUSCULAR | Status: AC
  Filled 2013-01-04: qty 10

## 2013-01-04 MED ORDER — BUPIVACAINE HCL (PF) 0.5 % IJ SOLN
INTRAMUSCULAR | Status: AC
  Filled 2013-01-04: qty 30

## 2013-01-04 MED ORDER — SCOPOLAMINE 1 MG/3DAYS TD PT72
MEDICATED_PATCH | TRANSDERMAL | Status: AC
  Filled 2013-01-04: qty 1

## 2013-01-04 MED ORDER — PROMETHAZINE HCL 25 MG/ML IJ SOLN
12.5000 mg | Freq: Once | INTRAMUSCULAR | Status: AC
  Administered 2013-01-04: 12.5 mg via INTRAVENOUS

## 2013-01-04 MED ORDER — SODIUM CHLORIDE 0.9 % IR SOLN
Status: DC | PRN
  Administered 2013-01-04: 1000 mL

## 2013-01-04 MED ORDER — DIPHENHYDRAMINE HCL 25 MG PO TABS: 25.0000 mg | ORAL_TABLET | Freq: Once | ORAL | Status: DC

## 2013-01-04 MED ORDER — MIDAZOLAM HCL 2 MG/2ML IJ SOLN
INTRAMUSCULAR | Status: AC
  Filled 2013-01-04: qty 2

## 2013-01-04 MED ORDER — CLINDAMYCIN PHOSPHATE 900 MG/50ML IV SOLN
900.0000 mg | Freq: Once | INTRAVENOUS | Status: AC
  Administered 2013-01-04: 900 mg via INTRAVENOUS

## 2013-01-04 MED ORDER — DIPHENHYDRAMINE HCL 25 MG PO CAPS
ORAL_CAPSULE | ORAL | Status: AC
  Filled 2013-01-04: qty 1

## 2013-01-04 MED ORDER — DIPHENHYDRAMINE HCL 25 MG PO CAPS
25.0000 mg | ORAL_CAPSULE | Freq: Once | ORAL | Status: AC
  Administered 2013-01-04: 25 mg via ORAL
  Filled 2013-01-04: qty 1

## 2013-01-04 MED ORDER — ROCURONIUM BROMIDE 100 MG/10ML IV SOLN
INTRAVENOUS | Status: DC | PRN
  Administered 2013-01-04: 25 mg via INTRAVENOUS

## 2013-01-04 MED ORDER — FENTANYL CITRATE 0.05 MG/ML IJ SOLN
INTRAMUSCULAR | Status: DC | PRN
  Administered 2013-01-04 (×2): 50 ug via INTRAVENOUS
  Administered 2013-01-04: 100 ug via INTRAVENOUS

## 2013-01-04 MED ORDER — PROMETHAZINE HCL 25 MG/ML IJ SOLN
INTRAMUSCULAR | Status: AC
  Filled 2013-01-04: qty 1

## 2013-01-04 MED ORDER — CHLORHEXIDINE GLUCONATE 4 % EX LIQD: 1.0000 "application " | Freq: Once | CUTANEOUS | Status: DC

## 2013-01-04 MED ORDER — MIDAZOLAM HCL 2 MG/2ML IJ SOLN
1.0000 mg | INTRAMUSCULAR | Status: DC | PRN
  Administered 2013-01-04: 2 mg via INTRAVENOUS

## 2013-01-04 MED ORDER — FENTANYL CITRATE 0.05 MG/ML IJ SOLN
25.0000 ug | INTRAMUSCULAR | Status: DC | PRN
  Administered 2013-01-04 (×4): 50 ug via INTRAVENOUS

## 2013-01-04 MED ORDER — SCOPOLAMINE 1 MG/3DAYS TD PT72
1.0000 | MEDICATED_PATCH | TRANSDERMAL | Status: DC
  Administered 2013-01-04: 1.5 mg via TRANSDERMAL

## 2013-01-04 MED ORDER — GLYCOPYRROLATE 0.2 MG/ML IJ SOLN
INTRAMUSCULAR | Status: AC
  Filled 2013-01-04: qty 2

## 2013-01-04 MED ORDER — HEMOSTATIC AGENTS (NO CHARGE) OPTIME
TOPICAL | Status: DC | PRN
  Administered 2013-01-04: 1 via TOPICAL

## 2013-01-04 MED ORDER — LIDOCAINE HCL (CARDIAC) 20 MG/ML IV SOLN
INTRAVENOUS | Status: DC | PRN
  Administered 2013-01-04: 50 mg via INTRAVENOUS

## 2013-01-04 MED ORDER — HYDROMORPHONE HCL PF 1 MG/ML IJ SOLN
INTRAMUSCULAR | Status: AC
  Filled 2013-01-04: qty 1

## 2013-01-04 MED ORDER — GLYCOPYRROLATE 0.2 MG/ML IJ SOLN
INTRAMUSCULAR | Status: DC | PRN
  Administered 2013-01-04: 0.4 mg via INTRAVENOUS

## 2013-01-04 MED ORDER — KETOROLAC TROMETHAMINE 30 MG/ML IJ SOLN
30.0000 mg | Freq: Once | INTRAMUSCULAR | Status: AC
  Administered 2013-01-04: 30 mg via INTRAVENOUS

## 2013-01-04 MED ORDER — NEOSTIGMINE METHYLSULFATE 1 MG/ML IJ SOLN
INTRAMUSCULAR | Status: DC | PRN
  Administered 2013-01-04: 3 mg via INTRAVENOUS

## 2013-01-04 MED ORDER — HYDROMORPHONE HCL PF 1 MG/ML IJ SOLN
0.2500 mg | INTRAMUSCULAR | Status: DC | PRN
  Administered 2013-01-04 (×3): 0.5 mg via INTRAVENOUS

## 2013-01-04 MED ORDER — ENOXAPARIN SODIUM 40 MG/0.4ML ~~LOC~~ SOLN
40.0000 mg | Freq: Once | SUBCUTANEOUS | Status: AC
  Administered 2013-01-04: 40 mg via SUBCUTANEOUS

## 2013-01-04 MED ORDER — LIDOCAINE HCL (PF) 1 % IJ SOLN
INTRAMUSCULAR | Status: AC
  Filled 2013-01-04: qty 5

## 2013-01-04 MED ORDER — LACTATED RINGERS IV SOLN
INTRAVENOUS | Status: DC
  Administered 2013-01-04: 10:00:00 via INTRAVENOUS
  Administered 2013-01-04: 1000 mL via INTRAVENOUS

## 2013-01-04 SURGICAL SUPPLY — 36 items
APPLIER CLIP LAPSCP 10X32 DD (CLIP) ×2 IMPLANT
BAG HAMPER (MISCELLANEOUS) ×2 IMPLANT
BAG SPEC RTRVL LRG 6X4 10 (ENDOMECHANICALS) ×1
CLOTH BEACON ORANGE TIMEOUT ST (SAFETY) ×2 IMPLANT
COVER LIGHT HANDLE STERIS (MISCELLANEOUS) ×4 IMPLANT
DECANTER SPIKE VIAL GLASS SM (MISCELLANEOUS) ×2 IMPLANT
DURAPREP 26ML APPLICATOR (WOUND CARE) ×2 IMPLANT
ELECT REM PT RETURN 9FT ADLT (ELECTROSURGICAL) ×2
ELECTRODE REM PT RTRN 9FT ADLT (ELECTROSURGICAL) ×1 IMPLANT
FILTER SMOKE EVAC LAPAROSHD (FILTER) ×2 IMPLANT
FORMALIN 10 PREFIL 120ML (MISCELLANEOUS) ×2 IMPLANT
GLOVE BIO SURGEON STRL SZ7.5 (GLOVE) ×2 IMPLANT
GLOVE BIOGEL PI IND STRL 7.0 (GLOVE) IMPLANT
GLOVE BIOGEL PI IND STRL 8 (GLOVE) IMPLANT
GLOVE BIOGEL PI INDICATOR 7.0 (GLOVE) ×2
GLOVE BIOGEL PI INDICATOR 8 (GLOVE) ×1
GLOVE ECLIPSE 6.5 STRL STRAW (GLOVE) ×1 IMPLANT
GLOVE SS BIOGEL STRL SZ 6.5 (GLOVE) IMPLANT
GLOVE SUPERSENSE BIOGEL SZ 6.5 (GLOVE) ×1
GOWN STRL REIN XL XLG (GOWN DISPOSABLE) ×6 IMPLANT
HEMOSTAT SNOW SURGICEL 2X4 (HEMOSTASIS) ×2 IMPLANT
INST SET LAPROSCOPIC AP (KITS) ×2 IMPLANT
KIT ROOM TURNOVER APOR (KITS) ×2 IMPLANT
KIT TROCAR LAP CHOLE (TROCAR) ×2 IMPLANT
MANIFOLD NEPTUNE II (INSTRUMENTS) ×2 IMPLANT
NS IRRIG 1000ML POUR BTL (IV SOLUTION) ×2 IMPLANT
PACK LAP CHOLE LZT030E (CUSTOM PROCEDURE TRAY) ×2 IMPLANT
PAD ARMBOARD 7.5X6 YLW CONV (MISCELLANEOUS) ×2 IMPLANT
POUCH SPECIMEN RETRIEVAL 10MM (ENDOMECHANICALS) ×2 IMPLANT
SET BASIN LINEN APH (SET/KITS/TRAYS/PACK) ×2 IMPLANT
SPONGE GAUZE 2X2 8PLY STRL LF (GAUZE/BANDAGES/DRESSINGS) ×8 IMPLANT
STAPLER VISISTAT (STAPLE) ×2 IMPLANT
SUT VICRYL 0 UR6 27IN ABS (SUTURE) ×2 IMPLANT
TAPE CLOTH SURG 4X10 WHT LF (GAUZE/BANDAGES/DRESSINGS) ×1 IMPLANT
WARMER LAPAROSCOPE (MISCELLANEOUS) ×2 IMPLANT
YANKAUER SUCT 12FT TUBE ARGYLE (SUCTIONS) ×2 IMPLANT

## 2013-01-04 NOTE — Progress Notes (Signed)
From OR. Arousing. Crying. Moaning/groaning. Oriented to place per nurse. Reassurance given.

## 2013-01-04 NOTE — Interval H&P Note (Signed)
History and Physical Interval Note:  01/04/2013 8:12 AM  [name]  has presented today for surgery, with the diagnosis of cholelithasis  The various methods of treatment have been discussed with the patient and family. After consideration of risks, benefits and other options for treatment, the patient has consented to  Procedure(s): LAPAROSCOPIC CHOLECYSTECTOMY (N/A) as a surgical intervention .  The patient's history has been reviewed, patient examined, no change in status, stable for surgery.  I have reviewed the patient's chart and labs.  Questions were answered to the patient's satisfaction.     Franky Macho A

## 2013-01-04 NOTE — Progress Notes (Signed)
Pt called after discharge to verify follow up appt.  Pt feeling much better after antiemetics.

## 2013-01-04 NOTE — Anesthesia Preprocedure Evaluation (Signed)
Anesthesia Evaluation  Patient identified by MRN, date of birth, ID band Patient awake    Reviewed: Allergy & Precautions, H&P , NPO status , Patient's Chart, lab work & pertinent test results  Airway Mallampati: I TM Distance: >3 FB     Dental  (+) Teeth Intact   Pulmonary neg pulmonary ROS,  breath sounds clear to auscultation        Cardiovascular negative cardio ROS  Rhythm:Regular Rate:Normal     Neuro/Psych  Neuromuscular disease (dx muscular dystrophy, no gait abnormality.)    GI/Hepatic   Endo/Other  Hypothyroidism   Renal/GU      Musculoskeletal   Abdominal   Peds  Hematology   Anesthesia Other Findings   Reproductive/Obstetrics                           Anesthesia Physical Anesthesia Plan  ASA: II  Anesthesia Plan: General   Post-op Pain Management:    Induction: Intravenous  Airway Management Planned: Oral ETT  Additional Equipment:   Intra-op Plan:   Post-operative Plan: Extubation in OR  Informed Consent: I have reviewed the patients History and Physical, chart, labs and discussed the procedure including the risks, benefits and alternatives for the proposed anesthesia with the patient or authorized representative who has indicated his/her understanding and acceptance.     Plan Discussed with:   Anesthesia Plan Comments:         Anesthesia Quick Evaluation

## 2013-01-04 NOTE — Transfer of Care (Signed)
Immediate Anesthesia Transfer of Care Note  Patient: [name]  Procedure(s) Performed: Procedure(s): LAPAROSCOPIC CHOLECYSTECTOMY (N/A)  Patient Location: PACU  Anesthesia Type:General  Level of Consciousness: awake and alert   Airway & Oxygen Therapy: Patient Spontanous Breathing and Patient connected to face mask oxygen  Post-op Assessment: Report given to PACU RN and Post -op Vital signs reviewed and stable  Post vital signs: Reviewed and stable  Complications: No apparent anesthesia complications

## 2013-01-04 NOTE — Progress Notes (Signed)
Moaning/groaning. Continues C/O post op abd pain. Med as noted. Crying at intervals.

## 2013-01-04 NOTE — Op Note (Signed)
Patient:  [name]  DOB:  1981-03-26  MRN:  [id number]   Preop Diagnosis:  Chronic cholecystitis  Postop Diagnosis:  Same  Procedure:  Laparoscopic cholecystectomy  Surgeon:  Franky Macho, M.D.  Anes:  General endotracheal  Indications:  Patient is a 32 year old white female presents with biliary colic secondary to chronic cholecystitis. The risks and benefits of the procedure including bleeding, infection, hepatobiliary injury, and the possibility of an open procedure were fully explained to the patient, gave informed consent.  Procedure note:  The patient was placed in the supine position. After induction of general endotracheal anesthesia, the abdomen was prepped and draped using usual sterile technique with DuraPrep. Surgical site confirmation was performed.  A supraumbilical incision was made down to the fascia. A Veress needle was introduced into the abdominal cavity and confirmation of placement was done using the saline drop test. The abdomen was then insufflated to 16 mm mercury pressure. An 11 mm trocar was introduced into the abdominal cavity under direct visualization without difficulty. The patient was placed in reverse Trendelenburg position and additional 11 mm trocar was placed the epigastric region and 5 mm trochars were placed in the right upper quadrant and right flank regions. The liver was inspected and noted within normal limits. The gallbladder was retracted in a dynamic fashion in order to expose the triangle of Calot. The cystic duct was first identified. Its juncture to the infundibulum was fully identified. Endoclips placed proximally and distally on the cystic duct, the cystic duct was divided. This is likewise done to the cystic artery. The gallbladder was then freed away from the gallbladder fossa using Bovie electrocautery. The gallbladder was delivered through the epigastric trocar site using an Endo Catch bag. The gallbladder fossa was inspected no abnormal  bleeding or bile leakage was noted. Surgicel is placed the gallbladder fossa. All fluid and air were then evacuated from the abdominal cavity prior to removal of the trochars.  All wounds were irrigated with normal saline. All wounds were injected with 0.5% Sensorcaine. The suprabuccal fashion as well as epigastric fascia were reapproximated using 0 Vicryl interrupted sutures. All skin incisions were closed using staples. Betadine ointment dry sterile dressings were applied.  All tape and needle counts were correct at the end of the procedure. The patient was extubated in the operating room and transferred to PACU in stable condition.  Complications:  None  EBL:  Minimal  Specimen:  Gallbladder

## 2013-01-04 NOTE — Anesthesia Postprocedure Evaluation (Signed)
Anesthesia Post Note  Patient: [name]  Procedure(s) Performed: Procedure(s) (LRB): LAPAROSCOPIC CHOLECYSTECTOMY (N/A)  Anesthesia type: General  Patient location: PACU  Post pain: Pain level controlled  Post assessment: Post-op Vital signs reviewed, Patient's Cardiovascular Status Stable, Respiratory Function Stable, Patent Airway, No signs of Nausea or vomiting and Pain level controlled  Last Vitals:  Filed Vitals:   01/04/13 1039  BP: 133/79  Pulse: 63  Temp: 36.6 C  Resp: 20    Post vital signs: Reviewed and stable  Level of consciousness: awake and alert   Complications: No apparent anesthesia complications

## 2013-01-04 NOTE — Anesthesia Procedure Notes (Signed)
Procedure Name: Intubation Date/Time: 01/04/2013 9:01 AM Performed by: Caren Macadam Pre-anesthesia Checklist: Patient identified, Emergency Drugs available, Suction available and Patient being monitored Patient Re-evaluated:Patient Re-evaluated prior to inductionOxygen Delivery Method: Circle System Utilized Preoxygenation: Pre-oxygenation with 100% oxygen Intubation Type: IV induction Ventilation: Mask ventilation without difficulty Laryngoscope Size: Miller and 2 Grade View: Grade I Tube type: Oral Number of attempts: 1 Airway Equipment and Method: stylet and oral airway Placement Confirmation: ETT inserted through vocal cords under direct vision,  positive ETCO2 and breath sounds checked- equal and bilateral Secured at: 22 cm Tube secured with: Tape Dental Injury: Teeth and Oropharynx as per pre-operative assessment

## 2013-01-07 ENCOUNTER — Encounter (HOSPITAL_COMMUNITY): Payer: Self-pay | Admitting: General Surgery

## 2013-04-03 ENCOUNTER — Encounter: Payer: Self-pay | Admitting: Internal Medicine

## 2013-12-15 IMAGING — NM NM HEPATO W/GB/PHARM/[PERSON_NAME]
2 series · 12 of 12 positions shown · non-contrast
Comparison: Abdominal CT 12/20/2012.

CLINICAL DATA: Right upper quadrant abdominal pain with nausea.

NUCLEAR MEDICINE HEPATOBILIARY IMAGING WITH GALLBLADDER EF
TECHNIQUE: Sequential images of the abdomen were obtained [DATE] minutes following intravenous administration of
radiopharmaceutical. After oral ingestion of Ensure, gallbladder
ejection fraction was determined.
Radiopharmaceutical:  5.0 mCi Kc-44m Choletec

[gb hepatobiliary scan · 3.19mm/px · 6 of 60 frames shown (1 of 2)]
[frame 6/60]
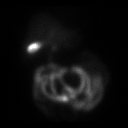
[frame 16/60]
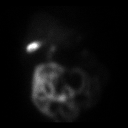
[frame 26/60]
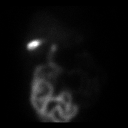
[frame 36/60]
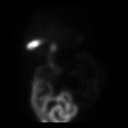
[frame 46/60]
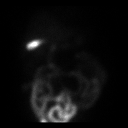
[frame 56/60]
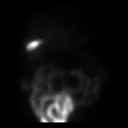

[gb hepatobiliary scan · 3.19mm/px · 6 of 60 frames shown (2 of 2)]
[frame 6/60]
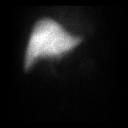
[frame 16/60]
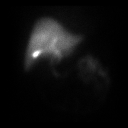
[frame 26/60]
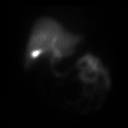
[frame 36/60]
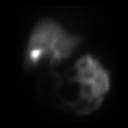
[frame 46/60]
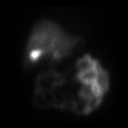
[frame 56/60]
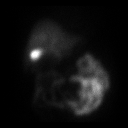

[12 of 12 positions shown; findings below may reference images not displayed]

FINDINGS: Initial images demonstrate homogenous hepatic activity
and prompt visualization of the biliary system, gallbladder and
small bowel.  Post Ensure, there is limited gallbladder contraction
but progressive small bowel activity

Gallbladder ejection fraction:  8.4%. Normal gallbladder ejection
fraction with Ensure is greater than 33%.

The patient did  experience symptoms (abdominal pain) after oral
ingestion of Ensure.
IMPRESSION: 1.  The cystic and common bile ducts are patent.
2.  Decreased gallbladder ejection fraction of approximately 8%.
The patient did experience some abdominal pain following ingestion
of Ensure.

## 2017-08-25 ENCOUNTER — Emergency Department (HOSPITAL_COMMUNITY)
Admission: EM | Admit: 2017-08-25 | Discharge: 2017-08-26 | Disposition: A | Discharge status: Home / Self Care | Payer: Medicaid Other | Attending: Emergency Medicine | Admitting: Emergency Medicine

## 2017-08-25 ENCOUNTER — Encounter (HOSPITAL_COMMUNITY): Payer: Self-pay | Admitting: *Deleted

## 2017-08-25 ENCOUNTER — Other Ambulatory Visit: Payer: Self-pay

## 2017-08-25 DIAGNOSIS — Z79899 Other long term (current) drug therapy: Secondary | ICD-10-CM | POA: Insufficient documentation

## 2017-08-25 DIAGNOSIS — R0789 Other chest pain: Secondary | ICD-10-CM | POA: Diagnosis present

## 2017-08-25 MED ORDER — CYCLOBENZAPRINE HCL 10 MG PO TABS
5.0000 mg | ORAL_TABLET | Freq: Once | ORAL | Status: AC
  Administered 2017-08-26: 5 mg via ORAL
  Filled 2017-08-25: qty 1

## 2017-08-25 MED ORDER — DEXAMETHASONE SODIUM PHOSPHATE 10 MG/ML IJ SOLN
10.0000 mg | Freq: Once | INTRAMUSCULAR | Status: AC
  Administered 2017-08-26: 10 mg via INTRAVENOUS
  Filled 2017-08-25: qty 1

## 2017-08-25 MED ORDER — KETOROLAC TROMETHAMINE 30 MG/ML IJ SOLN
30.0000 mg | Freq: Once | INTRAMUSCULAR | Status: AC
  Administered 2017-08-26: 30 mg via INTRAVENOUS
  Filled 2017-08-25: qty 1

## 2017-08-25 NOTE — ED Triage Notes (Signed)
Pt reports central chest pain and pressure x 6 days. [facility] and was told she had a pulled muscle in her chest. Pt states she can't get any relief. Pt was given and inhaler, prilosec, and ibuprofen without relief. Pt states her allergies are "acting up."

## 2017-08-26 ENCOUNTER — Emergency Department (HOSPITAL_COMMUNITY): Payer: Medicaid Other

## 2017-08-26 LAB — CBC WITH DIFFERENTIAL/PLATELET
BASOS ABS: 0.1 10*3/uL (ref 0.0–0.1)
BASOS PCT: 0 %
EOS PCT: 6 %
Eosinophils Absolute: 0.9 10*3/uL — ABNORMAL HIGH (ref 0.0–0.7)
HEMATOCRIT: 37.9 % (ref 36.0–46.0)
Hemoglobin: 12.7 g/dL (ref 12.0–15.0)
LYMPHS PCT: 29 %
Lymphs Abs: 3.9 10*3/uL (ref 0.7–4.0)
MCH: 29.2 pg (ref 26.0–34.0)
MCHC: 33.5 g/dL (ref 30.0–36.0)
MCV: 87.1 fL (ref 78.0–100.0)
MONO ABS: 0.7 10*3/uL (ref 0.1–1.0)
Monocytes Relative: 5 %
NEUTROS ABS: 8 10*3/uL — AB (ref 1.7–7.7)
Neutrophils Relative %: 60 %
PLATELETS: 420 10*3/uL — AB (ref 150–400)
RBC: 4.35 MIL/uL (ref 3.87–5.11)
RDW: 13 % (ref 11.5–15.5)
WBC: 13.4 10*3/uL — AB (ref 4.0–10.5)

## 2017-08-26 LAB — COMPREHENSIVE METABOLIC PANEL
ALK PHOS: 78 U/L (ref 38–126)
ALT: 16 U/L (ref 14–54)
ANION GAP: 11 (ref 5–15)
AST: 20 U/L (ref 15–41)
Albumin: 3.8 g/dL (ref 3.5–5.0)
BUN: 15 mg/dL (ref 6–20)
CALCIUM: 9.3 mg/dL (ref 8.9–10.3)
CO2: 22 mmol/L (ref 22–32)
Chloride: 103 mmol/L (ref 101–111)
Creatinine, Ser: 0.89 mg/dL (ref 0.44–1.00)
GFR calc Af Amer: >60 mL/min (ref >60)
Glucose, Bld: 98 mg/dL (ref 65–99)
POTASSIUM: 4.1 mmol/L (ref 3.5–5.1)
Sodium: 136 mmol/L (ref 135–145)
Total Bilirubin: 0.4 mg/dL (ref 0.3–1.2)
Total Protein: 7.4 g/dL (ref 6.5–8.1)

## 2017-08-26 LAB — D-DIMER, QUANTITATIVE: D-Dimer, Quant: 0.27 ug/mL-FEU (ref 0.00–0.50)

## 2017-08-26 LAB — TROPONIN I: Troponin I: <0.03 ng/mL (ref <0.03)

## 2017-08-26 MED ORDER — CYCLOBENZAPRINE HCL 5 MG PO TABS: 5.0000 mg | ORAL_TABLET | Freq: Three times a day (TID) | ORAL | 0 refills | Status: DC | PRN

## 2017-08-26 MED ORDER — NAPROXEN 500 MG PO TABS: ORAL_TABLET | ORAL | 0 refills | Status: DC

## 2017-08-26 NOTE — ED Notes (Signed)
Pt and family updated on plan of care,  

## 2017-08-26 NOTE — ED Notes (Signed)
Pt c/o mid center chest pain with sob, nausea, chills, nasal congestion, weakness that started Sunday, [facility], states that she was not better, went back to er the next night as well, still continues to have pain with no improvement,

## 2017-08-26 NOTE — ED Notes (Signed)
Pt. O2 stat remained at 100 during the entire ambulatory session.

## 2017-08-26 NOTE — ED Provider Notes (Signed)
[facility] EMERGENCY DEPARTMENT Provider Note   CSN: [id number] Arrival date & time: 08/25/17  2124  Time seen 23:38 PM    History   Chief Complaint Chief Complaint  Patient presents with  . Chest Pain    HPI [name] is a 36 y.o. female.  HPI patient states November 4 in the afternoon she started breathing funny and feeling short of breath.  She states her left chest feels tight.  She also states she has discomfort from the right side of her neck down into her right shoulder.  She states it hurts with range of motion.  She states her chest pain gets worse when she coughs or talks or eats.  She states when she eats she feels like the food goes down slowly and is trying to get stuck.  She denies any burning or acid feeling in her throat.  She states she possibly had fever today, but they did not check it.  She has had nausea without vomiting.  She started coughing yesterday that is dry.  She also has some clear rhinorrhea and sneezing.  She denies sore throat or diarrhea.  She was seen in the ED at [facility] on November 5 and 6 and was told she had chest wall pain.  She also was diagnosed with wheezing and was discharged with a pro-air inhaler, Prevacid, and ibuprofen.  She states she has been using her son's albuterol nebulizer.  She states she has never felt this way before.  She states she has been wheezing.  She denies being on any type of hormone replacement.  She denies any pain or swelling in her legs.  Patient states her maternal aunt has atrial fib, her maternal grandmother had a stroke, and her maternal uncle had a CABG done.  She denies any family history of DVT or PE.  Patient also states she supposed be on a thyroid replacement but she stopped it a year ago because she had trouble losing weight while taking it.  She also states she stopped taking Topamax because her son has seizures at night.  She also was on Singulair but she stopped it about a year ago.  PCP  OOT  Past Medical History:  Diagnosis Date  . Hyperlipemia   . Muscular dystrophy   . Thyroid disease     Patient Active Problem List   Diagnosis Date Noted  . RUQ pain 12/25/2012  . CLOS FRACTURE MID/PROXIMAL PHALANX/PHALANG HAND 09/09/2008    Past Surgical History:  Procedure Laterality Date  . APPENDECTOMY    . CESAREAN SECTION     X3  . FOOT SURGERY    . TONSILLECTOMY    . TUBAL LIGATION      OB History    No data available       Home Medications    Prior to Admission medications   Medication Sig Start Date End Date Taking? Authorizing Provider  cyclobenzaprine (FLEXERIL) 5 MG tablet Take 1 tablet (5 mg total) 3 (three) times daily as needed by mouth for muscle spasms. 08/26/17   Devoria Albe, MD  naproxen (NAPROSYN) 500 MG tablet Take 1 po BID with food prn pain 08/26/17   Devoria Albe, MD  omeprazole (PRILOSEC) 20 MG capsule Take 20 mg by mouth 2 (two) times daily.    [provider]  oxyCODONE-acetaminophen (PERCOCET) 7.5-325 MG per tablet Take 1 tablet by mouth every 4 (four) hours as needed for pain.    [provider]  Family History Family History  Problem Relation Age of Onset  . Colon cancer Neg Hx     Social History Social History   Tobacco Use  . Smoking status: Never Smoker  . Smokeless tobacco: Never Used  Substance Use Topics  . Alcohol use: No    Frequency: Never    Comment: occasionally  . Drug use: No  unemployed Moved her in August   Allergies   Other and Penicillins   Review of Systems Review of Systems  All other systems reviewed and are negative.    Physical Exam Updated Vital Signs BP 112/75   Pulse 74   Temp 98.4 F (36.9 C) (Oral)   Resp 17   Ht 5\' 5"  (1.651 m)   Wt 95.3 kg (210 lb)   SpO2 94%   BMI 34.95 kg/m   Vital signs normal    Physical Exam  Constitutional: She is oriented to person, place, and time. She appears well-developed and well-nourished.  Non-toxic appearance. She does  not appear ill. No distress.  HENT:  Head: Normocephalic and atraumatic.  Right Ear: External ear normal.  Left Ear: External ear normal.  Nose: Nose normal. No mucosal edema or rhinorrhea.  Mouth/Throat: Oropharynx is clear and moist and mucous membranes are normal. No dental abscesses or uvula swelling.  Eyes: Conjunctivae and EOM are normal. Pupils are equal, round, and reactive to light.  Neck: Normal range of motion and full passive range of motion without pain. Neck supple.    Tender over the right trapezius muscle  Cardiovascular: Normal rate, regular rhythm and normal heart sounds. Exam reveals no gallop and no friction rub.  No murmur heard. Pulmonary/Chest: Effort normal and breath sounds normal. No respiratory distress. She has no wheezes. She has no rhonchi. She has no rales. She exhibits tenderness. She exhibits no crepitus.    Abdominal: Soft. Normal appearance and bowel sounds are normal. She exhibits no distension. There is no tenderness. There is no rebound and no guarding.  Musculoskeletal: Normal range of motion. She exhibits no edema or tenderness.  Moves all extremities well, but has pain in her right shoulder on ROM of her shoulder.   Neurological: She is alert and oriented to person, place, and time. She has normal strength. No cranial nerve deficit.  Skin: Skin is warm, dry and intact. No rash noted. No erythema. No pallor.  Psychiatric: She has a normal mood and affect. Her speech is normal and behavior is normal. Her mood appears not anxious.  Nursing note and vitals reviewed.    ED Treatments / Results  Labs (all labs ordered are listed, but only abnormal results are displayed) Results for orders placed or performed during the hospital encounter of 08/25/17  Comprehensive metabolic panel  Result Value Ref Range   Sodium 136 135 - 145 mmol/L   Potassium 4.1 3.5 - 5.1 mmol/L   Chloride 103 101 - 111 mmol/L   CO2 22 22 - 32 mmol/L   Glucose, Bld 98 65 - 99  mg/dL   BUN 15 6 - 20 mg/dL   Creatinine, Ser 1.610.89 0.44 - 1.00 mg/dL   Calcium 9.3 8.9 - 09.610.3 mg/dL   Total Protein 7.4 6.5 - 8.1 g/dL   Albumin 3.8 3.5 - 5.0 g/dL   AST 20 15 - 41 U/L   ALT 16 14 - 54 U/L   Alkaline Phosphatase 78 38 - 126 U/L   Total Bilirubin 0.4 0.3 - 1.2 mg/dL   GFR calc non  Af Amer >60 >60 mL/min   GFR calc Af Amer >60 >60 mL/min   Anion gap 11 5 - 15  CBC with Differential  Result Value Ref Range   WBC 13.4 (H) 4.0 - 10.5 K/uL   RBC 4.35 3.87 - 5.11 MIL/uL   Hemoglobin 12.7 12.0 - 15.0 g/dL   HCT 16.137.9 09.636.0 - 04.546.0 %   MCV 87.1 78.0 - 100.0 fL   MCH 29.2 26.0 - 34.0 pg   MCHC 33.5 30.0 - 36.0 g/dL   RDW 40.913.0 81.111.5 - 91.415.5 %   Platelets 420 (H) 150 - 400 K/uL   Neutrophils Relative % 60 %   Neutro Abs 8.0 (H) 1.7 - 7.7 K/uL   Lymphocytes Relative 29 %   Lymphs Abs 3.9 0.7 - 4.0 K/uL   Monocytes Relative 5 %   Monocytes Absolute 0.7 0.1 - 1.0 K/uL   Eosinophils Relative 6 %   Eosinophils Absolute 0.9 (H) 0.0 - 0.7 K/uL   Basophils Relative 0 %   Basophils Absolute 0.1 0.0 - 0.1 K/uL  Troponin I  Result Value Ref Range   Troponin I <0.03 <0.03 ng/mL  D-dimer, quantitative (not at [facility])  Result Value Ref Range   D-Dimer, Quant 0.27 0.00 - 0.50 ug/mL-FEU   Laboratory interpretation all normal except persistent leukocytosis since 2012   #1 EKG  EKG Interpretation  Date/Time:  Friday August 25 2017 22:19:54 EST Ventricular Rate:  81 PR Interval:  148 QRS Duration: 86 QT Interval:  370 QTC Calculation: 429 R Axis:   14 Text Interpretation:  Normal sinus rhythm Nonspecific T wave abnormality No old tracing to compare Confirmed by Devoria AlbeKnapp, Shyam Dawson ([id number]) on 08/26/2017 12:45:08 AM      #2  EKG Interpretation  Date/Time:  Friday August 25 2017 22:46:30 EST Ventricular Rate:  69 PR Interval:  148 QRS Duration: 97 QT Interval:  398 QTC Calculation: 427 R Axis:   19 Text Interpretation:  Sinus rhythm Borderline T abnormalities, anterior leads  Since last tracing 30 minutes earlier T wave inversion no longer evident in Lateral leads Confirmed by Devoria AlbeKnapp, Adalin Vanderploeg ([id number]) on 08/26/2017 12:46:44 AM        Radiology Dg Chest 2 View  Result Date: 08/26/2017 CLINICAL DATA:  Acute onset of central chest pain and pressure. Nonproductive cough and shortness of breath. EXAM: CHEST  2 VIEW COMPARISON:  Chest radiograph performed 08/21/2017 FINDINGS: The lungs are well-aerated and clear. There is no evidence of focal opacification, pleural effusion or pneumothorax. The heart is normal in size; the mediastinal contour is within normal limits. No acute osseous abnormalities are seen. IMPRESSION: No acute cardiopulmonary process seen. Electronically Signed   By: Roanna RaiderJeffery  Chang M.D.   On: 08/26/2017 00:49    Procedures Procedures (including critical care time)  Medications Ordered in ED Medications  ketorolac (TORADOL) 30 MG/ML injection 30 mg (30 mg Intravenous Given 08/26/17 0015)  cyclobenzaprine (FLEXERIL) tablet 5 mg (5 mg Oral Given 08/26/17 0016)  dexamethasone (DECADRON) injection 10 mg (10 mg Intravenous Given 08/26/17 0013)     Initial Impression / Assessment and Plan / ED Course  I have reviewed the triage vital signs and the nursing notes.  Pertinent labs & imaging results that were available during my care of the patient were reviewed by me and considered in my medical decision making (see chart for details).    Pt was given IV toradol and oral flexeril for her chest wall pain. Ddimer was done to screen for DVT/PE.  Pt was ambulated by nursing staff and her pulse ox remained 100% on RA. She states her pain is better.   At time of discharge we discussed her lab results and CXR results. She was discharged home with NSAID and muscle relaxer. She started coughing yesterday and she is a nonsmoker so antibiotics not indicated at this time. She should continue the PPI for her feeling that food is slow to go down when she swallows.      Final Clinical Impressions(s) / ED Diagnoses   Final diagnoses:  Chest wall pain    ED Discharge Orders        Ordered    naproxen (NAPROSYN) 500 MG tablet     08/26/17 0152    cyclobenzaprine (FLEXERIL) 5 MG tablet  3 times daily PRN     08/26/17 0152      Plan discharge  Devoria Albe, MD, Concha Pyo, MD 08/26/17 0200

## 2017-08-26 NOTE — Discharge Instructions (Signed)
Use ice and heat for comfort. Take the medications as prescribed. Continue the prevacid and the inhaler as needed for your difficulty swallowing and wheezing.  Recheck if you get a fever or seem worse.  You will probably have this chest pain again in a few months, you can take ibuprofen 600 mg OTC 4 times a day as needed for pain.

## 2019-06-23 ENCOUNTER — Encounter (HOSPITAL_COMMUNITY): Payer: Self-pay

## 2019-06-23 ENCOUNTER — Other Ambulatory Visit: Payer: Self-pay

## 2019-06-23 ENCOUNTER — Emergency Department (HOSPITAL_COMMUNITY)
Admission: EM | Admit: 2019-06-23 | Discharge: 2019-06-23 | Disposition: A | Discharge status: Home / Self Care | Payer: PRIVATE HEALTH INSURANCE | Attending: Emergency Medicine | Admitting: Emergency Medicine

## 2019-06-23 ENCOUNTER — Emergency Department (HOSPITAL_COMMUNITY): Payer: PRIVATE HEALTH INSURANCE

## 2019-06-23 DIAGNOSIS — S63613A Unspecified sprain of left middle finger, initial encounter: Secondary | ICD-10-CM

## 2019-06-23 DIAGNOSIS — S6992XA Unspecified injury of left wrist, hand and finger(s), initial encounter: Secondary | ICD-10-CM | POA: Diagnosis present

## 2019-06-23 DIAGNOSIS — Y9389 Activity, other specified: Secondary | ICD-10-CM | POA: Insufficient documentation

## 2019-06-23 DIAGNOSIS — Y92512 Supermarket, store or market as the place of occurrence of the external cause: Secondary | ICD-10-CM | POA: Diagnosis not present

## 2019-06-23 DIAGNOSIS — W231XXA Caught, crushed, jammed, or pinched between stationary objects, initial encounter: Secondary | ICD-10-CM | POA: Diagnosis not present

## 2019-06-23 DIAGNOSIS — Y990 Civilian activity done for income or pay: Secondary | ICD-10-CM | POA: Diagnosis not present

## 2019-06-23 MED ORDER — IBUPROFEN 800 MG PO TABS
800.0000 mg | ORAL_TABLET | Freq: Once | ORAL | Status: AC
Start: 2019-06-23 — End: 2019-06-23
  Administered 2019-06-23: 14:00:00 800 mg via ORAL
  Filled 2019-06-23: qty 1

## 2019-06-23 NOTE — ED Provider Notes (Signed)
[facility] EMERGENCY DEPARTMENT Provider Note   CSN: [id number] Arrival date & time: 06/23/19  1315     History   Chief Complaint Chief Complaint  Patient presents with  . Finger Injury    HPI [name] is a 38 y.o. female presenting for evaluation of finger injury.  Patient states was prior to arrival she was stacking [facility] carts [facility] [employer] when her finger got caught and hyperextended at the distal portion.  Since then, she has had persistent pain.  She reports difficulty bending her finger due to pain.  She denies numbness or tingling.  She has not taken anything for pain including, ibuprofen.  Movement and palpation makes the pain worse, nothing is better.  It does not radiate anywhere.  She denies injury elsewhere.  She has no medical problems, takes no medications daily.  She is not on blood thinners.     HPI  Past Medical History:  Diagnosis Date  . Hyperlipemia   . Muscular dystrophy (HCC)   . Thyroid disease     Patient Active Problem List   Diagnosis Date Noted  . RUQ pain 12/25/2012  . CLOS FRACTURE MID/PROXIMAL PHALANX/PHALANG HAND 09/09/2008    Past Surgical History:  Procedure Laterality Date  . ABLATION    . APPENDECTOMY    . CESAREAN SECTION     X3  . CHOLECYSTECTOMY N/A 01/04/2013   Procedure: LAPAROSCOPIC CHOLECYSTECTOMY;  Surgeon: Dalia HeadingMark A Jenkins, MD;  Location: [facility] ORS;  Service: General;  Laterality: N/A;  . ESOPHAGOGASTRODUODENOSCOPY N/A 12/26/2012   Procedure: ESOPHAGOGASTRODUODENOSCOPY (EGD);  Surgeon: Corbin Adeobert M Rourk, MD;  Location: [facility] ENDO SUITE;  Service: Endoscopy;  Laterality: N/A;  12:45-moved to 1100 Leigh Ann notified pt  . FOOT SURGERY    . TONSILLECTOMY    . TUBAL LIGATION       OB History   No obstetric history on file.      Home Medications    Prior to Admission medications   Medication Sig Start Date End Date Taking? Authorizing Provider  cyclobenzaprine (FLEXERIL) 5 MG tablet Take 1 tablet (5 mg total) 3 (three) times  daily as needed by mouth for muscle spasms. 08/26/17   Devoria AlbeKnapp, Iva, MD  naproxen (NAPROSYN) 500 MG tablet Take 1 po BID with food prn pain 08/26/17   Devoria AlbeKnapp, Iva, MD  omeprazole (PRILOSEC) 20 MG capsule Take 20 mg by mouth 2 (two) times daily.    [provider]  oxyCODONE-acetaminophen (PERCOCET) 7.5-325 MG per tablet Take 1 tablet by mouth every 4 (four) hours as needed for pain.    [provider]    Family History Family History  Problem Relation Age of Onset  . Colon cancer Neg Hx     Social History Social History   Tobacco Use  . Smoking status: Never Smoker  . Smokeless tobacco: Never Used  Substance Use Topics  . Alcohol use: No    Frequency: Never    Comment: occasionally  . Drug use: No     Allergies   Other and Penicillins   Review of Systems Review of Systems  Musculoskeletal: Positive for arthralgias.  Hematological: Does not bruise/bleed easily.     Physical Exam Updated Vital Signs BP 120/75 (BP Location: Right Arm)   Pulse 82   Temp 98.1 F (36.7 C) (Oral)   Resp 16   SpO2 98%   Physical Exam Vitals signs and nursing note reviewed.  Constitutional:      General: She is not in acute  distress.    Appearance: She is well-developed.  HENT:     Head: Normocephalic and atraumatic.  Neck:     Musculoskeletal: Normal range of motion.  Pulmonary:     Effort: Pulmonary effort is normal.  Abdominal:     General: There is no distension.  Musculoskeletal:        General: Swelling and tenderness present.     Comments: Mild swelling of the left middle finger along the distal aspect.  No obvious contusions.  No obvious deformity.  Good distal cap refill.  Distal sensation intact.  Pain with any movement, specifically flexion of the finger.  However, patient is able to flex at the MCP, PIP and DIP. No subungal hematoma  Skin:    General: Skin is warm.     Capillary Refill: Capillary refill takes less than 2 seconds.     Findings: No  rash.  Neurological:     Mental Status: She is alert and oriented to person, place, and time.      ED Treatments / Results  Labs (all labs ordered are listed, but only abnormal results are displayed) Labs Reviewed - No data to display  EKG None  Radiology Dg Finger Middle Left  Result Date: 06/23/2019 CLINICAL DATA:  Left middle finger mashed between buggies at [employer], redness, pain and swelling EXAM: LEFT MIDDLE FINGER 2+V COMPARISON:  None. FINDINGS: There is no evidence of fracture or dislocation. There is no evidence of arthropathy or other focal bone abnormality. Soft tissues are unremarkable. IMPRESSION: No acute osseous abnormality in the left middle finger. Electronically Signed   By: Audie Pinto M.D.   On: 06/23/2019 14:15    Procedures Procedures (including critical care time)  Medications Ordered in ED Medications  ibuprofen (ADVIL) tablet 800 mg (800 mg Oral Given 06/23/19 1422)     Initial Impression / Assessment and Plan / ED Course  I have reviewed the triage vital signs and the nursing notes.  Pertinent labs & imaging results that were available during my care of the patient were reviewed by me and considered in my medical decision making (see chart for details).        Patient resenting for evaluation of finger injury.  Physical examination, she is neurovascularly intact.  No obvious tendon rupture or injury.  Will obtain x-rays for further evaluation.  X-rays viewed interpreted by me, no fracture or dislocation.  Discussed findings with patient.  Discussed symptomatic treatment with splint, Tylenol, ibuprofen, ice, and elevation.  Encourage follow-up with PCP symptoms not proving.  At this time, patient appears safe for discharge.  Return precautions given.  Patient states she understands and agrees to plan.  Final Clinical Impressions(s) / ED Diagnoses   Final diagnoses:  Injury of finger of left hand, initial encounter  Sprain of left middle  finger, unspecified site of finger, initial encounter    ED Discharge Orders    None       Stacey Heidelberg, PA-C 06/23/19 1445    Stacey Rice, MD 06/26/19 1446

## 2019-06-23 NOTE — Discharge Instructions (Signed)
Take ibuprofen 3 times a day with meals.  Do not take other anti-inflammatories at the same time (Advil, Motrin, naproxen, Aleve). You may supplement with Tylenol if you need further pain control. Use ice packs, 20 minutes at a time, 3 times a day to help with pain and swelling. Try and keep your hand elevated to decrease swelling. Follow-up with your primary care doctor in 1 week if pain is not improving. Return to the emergency room with any new, worsening, concerning symptoms.

## 2019-06-23 NOTE — ED Triage Notes (Signed)
Pt reports that she was pushing buggies and left middle finger got stuck and she felt the tip of finger go backwards. Works at [employer]

## 2019-07-21 ENCOUNTER — Emergency Department (HOSPITAL_COMMUNITY): Payer: Medicaid - Out of State

## 2019-07-21 ENCOUNTER — Other Ambulatory Visit: Payer: Self-pay

## 2019-07-21 ENCOUNTER — Emergency Department (HOSPITAL_COMMUNITY)
Admission: EM | Admit: 2019-07-21 | Discharge: 2019-07-21 | Disposition: A | Discharge status: Home / Self Care | Payer: Medicaid - Out of State | Attending: Emergency Medicine | Admitting: Emergency Medicine

## 2019-07-21 ENCOUNTER — Encounter (HOSPITAL_COMMUNITY): Payer: Self-pay | Admitting: *Deleted

## 2019-07-21 DIAGNOSIS — Z79899 Other long term (current) drug therapy: Secondary | ICD-10-CM | POA: Insufficient documentation

## 2019-07-21 DIAGNOSIS — R072 Precordial pain: Secondary | ICD-10-CM | POA: Diagnosis not present

## 2019-07-21 DIAGNOSIS — R0789 Other chest pain: Secondary | ICD-10-CM | POA: Diagnosis present

## 2019-07-21 LAB — CBC WITH DIFFERENTIAL/PLATELET
Abs Immature Granulocytes: 0.05 10*3/uL (ref 0.00–0.07)
Basophils Absolute: 0.1 10*3/uL (ref 0.0–0.1)
Basophils Relative: 1 %
Eosinophils Absolute: 0.5 10*3/uL (ref 0.0–0.5)
Eosinophils Relative: 5 %
HCT: 38.1 % (ref 36.0–46.0)
Hemoglobin: 12.2 g/dL (ref 12.0–15.0)
Immature Granulocytes: 1 %
Lymphocytes Relative: 39 %
Lymphs Abs: 4.1 10*3/uL — ABNORMAL HIGH (ref 0.7–4.0)
MCH: 28.6 pg (ref 26.0–34.0)
MCHC: 32 g/dL (ref 30.0–36.0)
MCV: 89.2 fL (ref 80.0–100.0)
Monocytes Absolute: 0.6 10*3/uL (ref 0.1–1.0)
Monocytes Relative: 6 %
Neutro Abs: 5.2 10*3/uL (ref 1.7–7.7)
Neutrophils Relative %: 48 %
Platelets: 408 10*3/uL — ABNORMAL HIGH (ref 150–400)
RBC: 4.27 MIL/uL (ref 3.87–5.11)
RDW: 12.7 % (ref 11.5–15.5)
WBC: 10.5 10*3/uL (ref 4.0–10.5)
nRBC: 0 % (ref 0.0–0.2)

## 2019-07-21 LAB — BASIC METABOLIC PANEL
Anion gap: 8 (ref 5–15)
BUN: 10 mg/dL (ref 6–20)
CO2: 24 mmol/L (ref 22–32)
Calcium: 8.8 mg/dL — ABNORMAL LOW (ref 8.9–10.3)
Chloride: 105 mmol/L (ref 98–111)
Creatinine, Ser: 0.79 mg/dL (ref 0.44–1.00)
GFR calc Af Amer: >60 mL/min (ref >60)
GFR calc non Af Amer: >60 mL/min (ref >60)
Glucose, Bld: 100 mg/dL — ABNORMAL HIGH (ref 70–99)
Potassium: 3.9 mmol/L (ref 3.5–5.1)
Sodium: 137 mmol/L (ref 135–145)

## 2019-07-21 LAB — D-DIMER, QUANTITATIVE: D-Dimer, Quant: 0.53 ug/mL-FEU — ABNORMAL HIGH (ref 0.00–0.50)

## 2019-07-21 LAB — URINALYSIS, ROUTINE W REFLEX MICROSCOPIC
Bilirubin Urine: NEGATIVE
Glucose, UA: NEGATIVE mg/dL
Hgb urine dipstick: NEGATIVE
Ketones, ur: NEGATIVE mg/dL
Leukocytes,Ua: NEGATIVE
Nitrite: NEGATIVE
Protein, ur: NEGATIVE mg/dL
Specific Gravity, Urine: 1.012 (ref 1.005–1.030)
pH: 6 (ref 5.0–8.0)

## 2019-07-21 LAB — TROPONIN I (HIGH SENSITIVITY)
Troponin I (High Sensitivity): <2 ng/L (ref <18)
Troponin I (High Sensitivity): 2 ng/L (ref <18)

## 2019-07-21 LAB — PREGNANCY, URINE: Preg Test, Ur: NEGATIVE

## 2019-07-21 MED ORDER — ONDANSETRON HCL 4 MG/2ML IJ SOLN
4.0000 mg | Freq: Once | INTRAMUSCULAR | Status: AC
  Administered 2019-07-21: 4 mg via INTRAVENOUS
  Filled 2019-07-21: qty 2

## 2019-07-21 MED ORDER — DIAZEPAM 5 MG PO TABS
5.0000 mg | ORAL_TABLET | Freq: Once | ORAL | Status: AC
  Administered 2019-07-21: 5 mg via ORAL
  Filled 2019-07-21: qty 1

## 2019-07-21 MED ORDER — NAPROXEN 500 MG PO TABS: 500.0000 mg | ORAL_TABLET | Freq: Two times a day (BID) | ORAL | 0 refills | Status: DC

## 2019-07-21 MED ORDER — IOHEXOL 350 MG/ML SOLN
100.0000 mL | Freq: Once | INTRAVENOUS | Status: AC | PRN
  Administered 2019-07-21: 18:00:00 100 mL via INTRAVENOUS

## 2019-07-21 MED ORDER — KETOROLAC TROMETHAMINE 30 MG/ML IJ SOLN
30.0000 mg | Freq: Once | INTRAMUSCULAR | Status: AC
  Administered 2019-07-21: 30 mg via INTRAVENOUS
  Filled 2019-07-21: qty 1

## 2019-07-21 MED ORDER — MORPHINE SULFATE (PF) 4 MG/ML IV SOLN
4.0000 mg | Freq: Once | INTRAVENOUS | Status: AC
  Administered 2019-07-21: 4 mg via INTRAVENOUS
  Filled 2019-07-21: qty 1

## 2019-07-21 MED ORDER — METHOCARBAMOL 500 MG PO TABS: 500.0000 mg | ORAL_TABLET | Freq: Three times a day (TID) | ORAL | 0 refills | Status: DC

## 2019-07-21 NOTE — Discharge Instructions (Signed)
Call your primary provider to arrange a follow-up appointment for this week.  Return to the ER for any worsening symptoms.

## 2019-07-21 NOTE — ED Triage Notes (Signed)
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 Per EMS 12 lead normal sinus rhythm.  Patient denies cardiac history.

## 2019-07-21 NOTE — ED Notes (Signed)
Patient transported to CT 

## 2019-07-21 NOTE — ED Provider Notes (Signed)
[facility] EMERGENCY DEPARTMENT Provider Note   CSN: [id number] Arrival date & time: 07/21/19  1605     History   Chief Complaint Chief Complaint  Patient presents with  . Chest Pain    HPI [name] is a 38 y.o. female.     HPI   [name] is a 38 y.o. female who presents to the Emergency Department complaining of sudden onset of sharp substernal chest pain.  Symptoms began approximately 1 to 2 hours prior to arrival.  She describes a deep sharp stabbing type pain to her middle chest that at times, radiates to her left shoulder and into her back.  She states she was at work at the time of onset.  She denies any prior heavy lifting or strenuous activity.  Pain has persisted greater than 30 minutes.  She denies previous heart disease or similar symptoms.  No new medications, recent travel or birth control.  Her pain is associated with mild nausea and shortness of breath, but no vomiting or diaphoresis.  She denies abdominal pain pain or weakness of her extremities, and recent illness.  Nothing makes the pain better or worse.      Past Medical History:  Diagnosis Date  . Hyperlipemia   . Muscular dystrophy ([place])   . Thyroid disease     Patient Active Problem List   Diagnosis Date Noted  . RUQ pain 12/25/2012  . CLOS FRACTURE MID/PROXIMAL PHALANX/PHALANG HAND 09/09/2008    Past Surgical History:  Procedure Laterality Date  . ABLATION    . APPENDECTOMY    . CESAREAN SECTION     X3  . CHOLECYSTECTOMY N/A 01/04/2013   Procedure: LAPAROSCOPIC CHOLECYSTECTOMY;  Surgeon: Jamesetta So, MD;  Location: [facility] ORS;  Service: General;  Laterality: N/A;  . ESOPHAGOGASTRODUODENOSCOPY N/A 12/26/2012   Procedure: ESOPHAGOGASTRODUODENOSCOPY (EGD);  Surgeon: Daneil Dolin, MD;  Location: [facility] ENDO SUITE;  Service: Endoscopy;  Laterality: N/A;  12:45-moved to [place] notified [name]  . FOOT SURGERY    . TONSILLECTOMY    . TUBAL LIGATION       OB History   No obstetric  history on file.      Home Medications    Prior to Admission medications   Medication Sig Start Date End Date Taking? Authorizing Provider  cyclobenzaprine (FLEXERIL) 5 MG tablet Take 1 tablet (5 mg total) 3 (three) times daily as needed by mouth for muscle spasms. 08/26/17   Rolland Porter, MD  naproxen (NAPROSYN) 500 MG tablet Take 1 po BID with food prn pain 08/26/17   Rolland Porter, MD  omeprazole (PRILOSEC) 20 MG capsule Take 20 mg by mouth 2 (two) times daily.    [provider]  oxyCODONE-acetaminophen (PERCOCET) 7.5-325 MG per tablet Take 1 tablet by mouth every 4 (four) hours as needed for pain.    [provider]    Family History Family History  Problem Relation Age of Onset  . Colon cancer Neg Hx     Social History Social History   Tobacco Use  . Smoking status: Never Smoker  . Smokeless tobacco: Never Used  Substance Use Topics  . Alcohol use: No    Frequency: Never    Comment: occasionally  . Drug use: No     Allergies   Other and Penicillins   Review of Systems Review of Systems  Constitutional: Negative for chills and fever.  HENT: Negative for congestion and sore throat.   Respiratory: Positive for shortness  of breath.   Cardiovascular: Positive for chest pain. Negative for palpitations and leg swelling.  Gastrointestinal: Positive for nausea. Negative for abdominal pain and vomiting.  Genitourinary: Negative for difficulty urinating, dysuria and flank pain.  Musculoskeletal: Positive for back pain. Negative for arthralgias, joint swelling, neck pain and neck stiffness.  Skin: Negative for color change and wound.  Neurological: Negative for dizziness, syncope, weakness, numbness and headaches.     Physical Exam Updated Vital Signs BP 104/68   Pulse 60   Temp 97.9 F (36.6 C) (Oral)   Resp 17   Ht 5\' 5"  (1.651 m)   Wt 99.8 kg   SpO2 99%   BMI 36.61 kg/m   Physical Exam Vitals signs and nursing note reviewed.   Constitutional:      Appearance: Normal appearance. She is not ill-appearing or toxic-appearing.  HENT:     Head: Normocephalic.     Mouth/Throat:     Mouth: Mucous membranes are moist.  Neck:     Musculoskeletal: Normal range of motion and neck supple.     Thyroid: No thyromegaly.     Meningeal: Kernig's sign absent.  Cardiovascular:     Rate and Rhythm: Normal rate and regular rhythm.     Pulses: Normal pulses.  Pulmonary:     Effort: Pulmonary effort is normal.     Breath sounds: Normal breath sounds. No wheezing.  Abdominal:     Palpations: Abdomen is soft.     Tenderness: There is no abdominal tenderness. There is no guarding or rebound.  Genitourinary:    Rectum: No anal fissure.  Musculoskeletal: Normal range of motion.     Right lower leg: No edema.     Left lower leg: No edema.  Skin:    General: Skin is warm and dry.     Capillary Refill: Capillary refill takes less than 2 seconds.     Findings: No rash.  Neurological:     General: No focal deficit present.     Mental Status: She is alert and oriented to person, place, and time.     Sensory: No sensory deficit.  Psychiatric:     Comments: [name] is mildly anxious appearing      ED Treatments / Results  Labs (all labs ordered are listed, but only abnormal results are displayed) Labs Reviewed  BASIC METABOLIC PANEL - Abnormal; Notable for the following components:      Result Value   Glucose, Bld 100 (*)    Calcium 8.8 (*)    All other components within normal limits  CBC WITH DIFFERENTIAL/PLATELET - Abnormal; Notable for the following components:   Platelets 408 (*)    Lymphs Abs 4.1 (*)    All other components within normal limits  URINALYSIS, ROUTINE W REFLEX MICROSCOPIC - Abnormal; Notable for the following components:   APPearance HAZY (*)    All other components within normal limits  D-DIMER, QUANTITATIVE (NOT AT [facility]) - Abnormal; Notable for the following components:   D-Dimer, Quant 0.53 (*)    All  other components within normal limits  PREGNANCY, URINE  TROPONIN I (HIGH SENSITIVITY)  TROPONIN I (HIGH SENSITIVITY)    EKG EKG Interpretation  Date/Time:  Sunday July 21 2019 16:08:19 EDT Ventricular Rate:  67 PR Interval:    QRS Duration: 101 QT Interval:  434 QTC Calculation: 459 R Axis:   25 Text Interpretation:  Sinus rhythm since last tracing no significant change Confirmed by 12-26-1989 [phone]) on 07/21/2019 4:23:40 PM  Radiology Ct Angio Chest Pe W And/or Wo Contrast  Result Date: 07/21/2019 CLINICAL DATA:  Central chest pain EXAM: CT ANGIOGRAPHY CHEST WITH CONTRAST TECHNIQUE: Multidetector CT imaging of the chest was performed using the standard protocol during bolus administration of intravenous contrast. Multiplanar CT image reconstructions and MIPs were obtained to evaluate the vascular anatomy. CONTRAST:  100mL OMNIPAQUE IOHEXOL 350 MG/ML SOLN COMPARISON:  Chest x-ray 07/21/2019 FINDINGS: Cardiovascular: Satisfactory opacification of the pulmonary arteries to the segmental level. No evidence of pulmonary embolism. Normal heart size. No pericardial effusion. Nonaneurysmal aorta. No dissection is seen. Mediastinum/Nodes: No enlarged mediastinal, hilar, or axillary lymph nodes. Thyroid gland, trachea, and esophagus demonstrate no significant findings. Lungs/Pleura: Lungs are clear. No pleural effusion or pneumothorax. Upper Abdomen: Hepatic steatosis. No acute abnormality. Status post cholecystectomy. Musculoskeletal: No chest wall abnormality. No acute or significant osseous findings. Review of the MIP images confirms the above findings. IMPRESSION: 1. Negative for acute pulmonary embolus or aortic dissection. 2. Clear lung fields 3. Hepatic steatosis Electronically Signed   By: Jasmine PangKim  Fujinaga M.D.   On: 07/21/2019 19:07   Dg Chest Portable 1 View  Result Date: 07/21/2019 CLINICAL DATA:  Central chest pain EXAM: PORTABLE CHEST 1 VIEW COMPARISON:  08/26/2017 FINDINGS: The  heart size and mediastinal contours are within normal limits. Both lungs are clear. The visualized skeletal structures are unremarkable. IMPRESSION: No acute abnormality of the lungs in [facility] portable projection. Electronically Signed   By: Lauralyn PrimesAlex  Bibbey M.D.   On: 07/21/2019 16:43    Procedures Procedures (including critical care time)  Medications Ordered in ED Medications - No data to display   Initial Impression / Assessment and Plan / ED Course  I have reviewed the triage vital signs and the nursing notes.  Pertinent labs & imaging results that were available during my care of the patient were reviewed by me and considered in my medical decision making (see chart for details).       Patient with sudden onset of substernal chest pain.  Work-up today has been reassuring.  Low clinical suspicion for ACS.  CT Angie of the chest is negative for PE.  Vital signs reviewed, and are reassuring.  Patient appears appropriate for discharge home she agrees to close follow-up with her PCP.  Return precautions were discussed.   Final Clinical Impressions(s) / ED Diagnoses   Final diagnoses:  Precordial pain    ED Discharge Orders    None       Rosey Bathriplett, Annalaura Sauseda, PA-C 07/21/19 2051    Mancel BaleWentz, Elliott, MD 07/22/19 1010

## 2019-12-09 ENCOUNTER — Emergency Department (HOSPITAL_COMMUNITY)
Admission: EM | Admit: 2019-12-09 | Discharge: 2019-12-10 | Disposition: A | Discharge status: Home / Self Care | Payer: BC Managed Care – PPO | Attending: Emergency Medicine | Admitting: Emergency Medicine

## 2019-12-09 ENCOUNTER — Encounter (HOSPITAL_COMMUNITY): Payer: Self-pay | Admitting: Emergency Medicine

## 2019-12-09 ENCOUNTER — Other Ambulatory Visit: Payer: Self-pay

## 2019-12-09 DIAGNOSIS — L03032 Cellulitis of left toe: Secondary | ICD-10-CM | POA: Insufficient documentation

## 2019-12-09 DIAGNOSIS — R2242 Localized swelling, mass and lump, left lower limb: Secondary | ICD-10-CM | POA: Diagnosis present

## 2019-12-09 DIAGNOSIS — B353 Tinea pedis: Secondary | ICD-10-CM | POA: Diagnosis not present

## 2019-12-09 NOTE — ED Triage Notes (Signed)
Pt reports left foot swelling and inflammation. Pt states she "may have gotten bit by an spider." Pt reports tingling in her left calf. Denies fevers at home.

## 2019-12-10 MED ORDER — SULFAMETHOXAZOLE-TRIMETHOPRIM 800-160 MG PO TABS: 1.0000 | ORAL_TABLET | Freq: Two times a day (BID) | ORAL | 0 refills | Status: AC

## 2019-12-10 MED ORDER — CLOTRIMAZOLE 1 % EX CREA: 1.0000 "application " | TOPICAL_CREAM | Freq: Two times a day (BID) | CUTANEOUS | 0 refills | Status: AC

## 2019-12-10 MED ORDER — HYDROCODONE-ACETAMINOPHEN 5-325 MG PO TABS: 1.0000 | ORAL_TABLET | ORAL | 0 refills | Status: AC | PRN

## 2019-12-10 NOTE — ED Provider Notes (Signed)
[facility] EMERGENCY DEPARTMENT Provider Note   CSN: [id number] Arrival date & time: 12/09/19  2026     History Chief Complaint  Patient presents with  . Insect Bite    [name] is a 39 y.o. female.  HPI      [name] is a 39 y.o. female who presents to the Emergency Department complaining of pain, redness and swelling of her distal left foot.  Symptoms began one day prior to arrival.  States that she woke this morning with worsening pain to her foot and redness that has increased throughout the day.  Pain worse with weight bearing.  She denies known injury, new shoes, fever, chills.  Not a diabetic.     Past Medical History:  Diagnosis Date  . Hyperlipemia   . Muscular dystrophy ([place])   . Thyroid disease     Patient Active Problem List   Diagnosis Date Noted  . RUQ pain 12/25/2012  . CLOS FRACTURE MID/PROXIMAL PHALANX/PHALANG HAND 09/09/2008    Past Surgical History:  Procedure Laterality Date  . ABLATION    . APPENDECTOMY    . CESAREAN SECTION     X3  . CHOLECYSTECTOMY N/A 01/04/2013   Procedure: LAPAROSCOPIC CHOLECYSTECTOMY;  Surgeon: Jamesetta So, MD;  Location: [facility] ORS;  Service: General;  Laterality: N/A;  . ESOPHAGOGASTRODUODENOSCOPY N/A 12/26/2012   Procedure: ESOPHAGOGASTRODUODENOSCOPY (EGD);  Surgeon: Daneil Dolin, MD;  Location: [facility];  Service: Endoscopy;  Laterality: N/A;  12:45-moved to [place] notified pt  . FOOT SURGERY    . TONSILLECTOMY    . TUBAL LIGATION       OB History   No obstetric history on file.     Family History  Problem Relation Age of Onset  . Colon cancer Neg Hx     Social History   Tobacco Use  . Smoking status: Never Smoker  . Smokeless tobacco: Never Used  Substance Use Topics  . Alcohol use: No    Comment: occasionally  . Drug use: No    Home Medications Prior to Admission medications   Not on File    Allergies    Other and Penicillins  Review of Systems   Review of  Systems  Constitutional: Negative for activity change, appetite change, chills and fever.  HENT: Negative for facial swelling, sore throat and trouble swallowing.   Respiratory: Negative for chest tightness and shortness of breath.   Musculoskeletal: Negative for neck pain and neck stiffness.  Skin: Positive for color change (left footpain, redness and swelling ). Negative for rash and wound.  Neurological: Negative for dizziness, weakness, numbness and headaches.    Physical Exam Updated Vital Signs BP 136/88 (BP Location: Left Arm)   Pulse 82   Temp 97.8 F (36.6 C) (Oral)   Resp 16   SpO2 100%   Physical Exam Vitals and nursing note reviewed.  Constitutional:      General: She is not in acute distress.    Appearance: Normal appearance. She is not toxic-appearing.  Cardiovascular:     Rate and Rhythm: Normal rate and regular rhythm.     Pulses: Normal pulses.  Pulmonary:     Effort: Pulmonary effort is normal.  Musculoskeletal:        General: Swelling and tenderness present. No signs of injury.     Comments: 6 cm focal area of erythema at the base of the third and fourth toes.  There is a small abraded area of  the web space between the affected toes with scaling noted to the web spaces of all toes.  Mild edema noted of the distal foot.  No lymphangitis.  No tenderness proximally  Skin:    General: Skin is warm.     Capillary Refill: Capillary refill takes less than 2 seconds.     Findings: Erythema present.  Neurological:     General: No focal deficit present.     Mental Status: She is alert.     ED Results / Procedures / Treatments   Labs (all labs ordered are listed, but only abnormal results are displayed) Labs Reviewed - No data to display  EKG None  Radiology No results found.  Procedures Procedures (including critical care time)  Medications Ordered in ED Medications - No data to display  ED Course  I have reviewed the triage vital signs and the  nursing notes.  Pertinent labs & imaging results that were available during my care of the patient were reviewed by me and considered in my medical decision making (see chart for details).    MDM Rules/Calculators/A&P                      Pt with likely tinea pedis and secondary cellulitis of the toe.  NV intact.  She is otherwise well appearing.  Will treat with antifungal, encouraged to keep her feet clean and dry.  rx for oral antibiotic.  Leading edge of erythema marked by me and return precautions discussed.   Final Clinical Impression(s) / ED Diagnoses Final diagnoses:  Tinea pedis of left foot  Cellulitis of third toe of left foot    Rx / DC Orders ED Discharge Orders    None       Rosey Bath 12/10/19 0032    Dione Booze, MD 12/10/19 [phone]

## 2019-12-10 NOTE — Discharge Instructions (Signed)
Elevate your foot when possible.  Warm water soaks, but be sure to dry your feet well.  Try to avoid wearing dark-colored socks.  You may need to treat your foot with the cream for at least 3 weeks.  Follow-up with your primary doctor for recheck or return to the ER for any worsening symptoms

## 2019-12-11 MED FILL — Hydrocodone-Acetaminophen Tab 5-325 MG: ORAL | Qty: 6 | Status: AC

## 2020-03-30 ENCOUNTER — Other Ambulatory Visit: Payer: Self-pay

## 2020-03-30 ENCOUNTER — Encounter (HOSPITAL_COMMUNITY): Payer: Self-pay | Admitting: *Deleted

## 2020-03-30 ENCOUNTER — Emergency Department (HOSPITAL_COMMUNITY): Payer: Self-pay

## 2020-03-30 ENCOUNTER — Emergency Department (HOSPITAL_COMMUNITY)
Admission: EM | Admit: 2020-03-30 | Discharge: 2020-03-30 | Disposition: A | Discharge status: Home / Self Care | Payer: Self-pay | Attending: Emergency Medicine | Admitting: Emergency Medicine

## 2020-03-30 DIAGNOSIS — Y939 Activity, unspecified: Secondary | ICD-10-CM | POA: Insufficient documentation

## 2020-03-30 DIAGNOSIS — Y929 Unspecified place or not applicable: Secondary | ICD-10-CM | POA: Insufficient documentation

## 2020-03-30 DIAGNOSIS — Y999 Unspecified external cause status: Secondary | ICD-10-CM | POA: Insufficient documentation

## 2020-03-30 DIAGNOSIS — S8992XA Unspecified injury of left lower leg, initial encounter: Secondary | ICD-10-CM | POA: Insufficient documentation

## 2020-03-30 DIAGNOSIS — W1849XA Other slipping, tripping and stumbling without falling, initial encounter: Secondary | ICD-10-CM | POA: Insufficient documentation

## 2020-03-30 NOTE — ED Provider Notes (Signed)
[facility] EMERGENCY DEPARTMENT Provider Note   CSN: [id number] Arrival date & time: 03/30/20  2010     History Chief Complaint  Patient presents with  . Knee Pain    [name] is a 39 y.o. female with no pertinent past medical history that presents to the emergency department today for left knee injury that occurred this evening.  Patient states that she was in the mud and slipped and landed on a rock on her left knee.  Denies any twisting or popping sensation.  No injury to that knee previously.  Denies any blood thinners.  Patient states that she is having extreme knee pain over her patella.  States that she was unable to walk here due to the pain.  Has not taken anything for this.  Denies any radiation down her leg or up into her groin.  Denies any swelling.  Denies any paresthesias or weakness.  Rock did  not cause a laceration or break skin.  HPI     Past Medical History:  Diagnosis Date  . Hyperlipemia   . Muscular dystrophy ([facility])   . Thyroid disease     Patient Active Problem List   Diagnosis Date Noted  . RUQ pain 12/25/2012  . CLOS FRACTURE MID/PROXIMAL PHALANX/PHALANG HAND 09/09/2008    Past Surgical History:  Procedure Laterality Date  . ABLATION    . APPENDECTOMY    . CESAREAN SECTION     X3  . CHOLECYSTECTOMY N/A 01/04/2013   Procedure: LAPAROSCOPIC CHOLECYSTECTOMY;  Surgeon: Dalia Heading, MD;  Location: [facility] ORS;  Service: General;  Laterality: N/A;  . ESOPHAGOGASTRODUODENOSCOPY N/A 12/26/2012   Procedure: ESOPHAGOGASTRODUODENOSCOPY (EGD);  Surgeon: Corbin Ade, MD;  Location: [facility];  Service: Endoscopy;  Laterality: N/A;  12:45-moved to [address] [place] notified pt  . FOOT SURGERY    . TONSILLECTOMY    . TUBAL LIGATION       OB History   No obstetric history on file.     Family History  Problem Relation Age of Onset  . Colon cancer Neg Hx     Social History   Tobacco Use  . Smoking status: Never Smoker  . Smokeless tobacco:  Never Used  Vaping Use  . Vaping Use: Never used  Substance Use Topics  . Alcohol use: No    Comment: occasionally  . Drug use: No    Home Medications Prior to Admission medications   Medication Sig Start Date End Date Taking? Authorizing Provider  clotrimazole (LOTRIMIN) 1 % cream Apply 1 application topically 2 (two) times daily. 12/10/19   Triplett, Tammy, PA-C  HYDROcodone-acetaminophen (NORCO/VICODIN) 5-325 MG tablet Take 1 tablet by mouth every 4 (four) hours as needed. 12/10/19   Triplett, Tammy, PA-C    Allergies    Other and Penicillins  Review of Systems   Review of Systems  Constitutional: Negative for diaphoresis, fatigue and fever.  Eyes: Negative for visual disturbance.  Respiratory: Negative for shortness of breath.   Cardiovascular: Negative for chest pain.  Gastrointestinal: Negative for nausea and vomiting.  Musculoskeletal: Positive for arthralgias (Left knee pain). Negative for back pain and myalgias.  Skin: Negative for color change, pallor, rash and wound.  Neurological: Negative for syncope, weakness, light-headedness, numbness and headaches.  Psychiatric/Behavioral: Negative for behavioral problems and confusion.    Physical Exam Updated Vital Signs BP 131/82 (BP Location: Left Arm)   Pulse 94   Temp 98.4 F (36.9 C) (Oral)   Resp 18  Ht 5\' 4"  (1.626 m)   Wt 102.1 kg   SpO2 98%   BMI 38.62 kg/m   Physical Exam Constitutional:      General: She is not in acute distress.    Appearance: Normal appearance. She is not ill-appearing, toxic-appearing or diaphoretic.  Cardiovascular:     Rate and Rhythm: Normal rate and regular rhythm.     Pulses: Normal pulses.  Pulmonary:     Effort: Pulmonary effort is normal.     Breath sounds: Normal breath sounds.  Musculoskeletal:     Left knee: No bony tenderness or crepitus. Decreased range of motion. Tenderness present over the medial joint line. Normal alignment, normal meniscus and normal patellar  mobility.     Instability Tests: Anterior drawer test negative. Posterior drawer test negative. Anterior Lachman test negative. Medial McMurray test negative and lateral McMurray test negative.     Comments: Patient with pain over medial joint line and over patella.  Patient is able to move knee in all directions, however is restricted due to pain.  Negative instability tests.  No wound breaks, no overlying skin infections.  No erythema or induration.  Normal sensation throughout.  DP and PT 2+.  Cap refill normal.  Right leg without any abnormalities.  Skin:    General: Skin is warm and dry.     Capillary Refill: Capillary refill takes less than 2 seconds.  Neurological:     General: No focal deficit present.     Mental Status: She is alert and oriented to person, place, and time.  Psychiatric:        Mood and Affect: Mood normal.        Behavior: Behavior normal.        Thought Content: Thought content normal.     ED Results / Procedures / Treatments   Labs (all labs ordered are listed, but only abnormal results are displayed) Labs Reviewed - No data to display  EKG None  Radiology DG Knee Complete 4 Views Left  Result Date: 03/30/2020 CLINICAL DATA:  Fall.  Knee pain EXAM: LEFT KNEE - COMPLETE 4+ VIEW COMPARISON:  None FINDINGS: No evidence of fracture, dislocation, or joint effusion. No evidence of arthropathy or other focal bone abnormality. Soft tissues are unremarkable. IMPRESSION: Negative. Electronically Signed   By: Kerby Moors M.D.   On: 03/30/2020 21:36    Procedures Procedures (including critical care time)  Medications Ordered in ED Medications - No data to display  ED Course  I have reviewed the triage vital signs and the nursing notes.  Pertinent labs & imaging results that were available during my care of the patient were reviewed by me and considered in my medical decision making (see chart for details).    MDM Rules/Calculators/A&P                          [name] is a 39 y.o. female with no pertinent past medical history that presents to the emergency department today for left knee injury that occurred this evening. Xrays negative. Pt with negative instability tests, however is in extreme pain when trying to access knee. Pt distally neurovascularly intact. No skin breaks. Will place pt in knee imobilizer and have pt follow up with ortho. Pt agreeable.    .Doubt need for further emergent work up at this time. I explained the diagnosis and have given explicit precautions to return to the ER including for any other new  or worsening symptoms. The patient understands and accepts the medical plan as it's been dictated and I have answered their questions. Discharge instructions concerning home care and prescriptions have been given. The patient is STABLE and is discharged to home in good condition.   Final Clinical Impression(s) / ED Diagnoses Final diagnoses:  Injury of left knee, initial encounter    Rx / DC Orders ED Discharge Orders    None       Farrel Gordon, PA-C 03/31/20 1245    Bethann Berkshire, MD 03/31/20 2234

## 2020-03-30 NOTE — Discharge Instructions (Signed)
You are seen today for left knee injury, as we spoke about this could be a sprain.  I want you to use the brace and crutches until you follow-up with Dr. Romeo Apple.  I provided you with your information I want you to call their office in the morning.  I want you to take naproxen for the pain as we discussed.  I want you to also follow the instructions below.  Tests performed today include: An x-ray of the affected area - does NOT show any broken bones or dislocations.  Vital signs. See below for your results today.   Home care instructions: -- *PRICE in the first 24-48 hours after injury: Protect (with brace, splint, sling), if given by your provider Rest Ice- Do not apply ice pack directly to your skin, place towel or similar between your skin and ice/ice pack. Apply ice for 20 min, then remove for 40 min while awake Compression- Wear brace, elastic bandage, splint as directed by your provider Elevate affected extremity above the level of your heart when not walking around for the first 24-48 hours   Use Ibuprofen (Motrin/Advil) 600mg  every 6 hours as needed for pain (do not exceed max dose in 24 hours, 2400mg )  Follow-up instructions: Please follow-up with your primary care provider or the provided orthopedic physician (bone specialist) if you continue to have significant pain in 1 week. In this case you may have a more severe injury that requires further care.   Return instructions:  Please return if your toes or feet are numb or tingling, appear gray or blue, or you have severe pain (also elevate the leg and loosen splint or wrap if you were given one) Please return to the Emergency Department if you experience worsening symptoms.  Please return if you have any other emergent concerns. Additional Information:  Your vital signs today were: BP 107/64 (BP Location: Right Arm)   Pulse (!) 103   Temp 97.9 F (36.6 C) (Oral)   Resp 18   Ht 5\' 4"  (1.626 m)   Wt 102.1 kg   SpO2 96%   BMI  38.62 kg/m  If your blood pressure (BP) was elevated above 135/85 this visit, please have this repeated by your doctor within one month. ---------------

## 2020-03-30 NOTE — ED Notes (Signed)
Ice pack given

## 2020-03-30 NOTE — ED Triage Notes (Signed)
Pt slipped in mud and landed on her left knee

## 2020-04-10 ENCOUNTER — Ambulatory Visit: Payer: Self-pay | Admitting: Orthopedic Surgery

## 2020-04-10 ENCOUNTER — Encounter: Payer: Self-pay | Admitting: Orthopedic Surgery

## 2022-12-15 ENCOUNTER — Encounter: Payer: Self-pay | Admitting: Radiology
